# Patient Record
Sex: Male | Born: 1975 | Race: White | Hispanic: No | Marital: Married | Smoking: Never smoker
Health system: Southern US, Community
[De-identification: ages and names within clinical notes are randomized; demographics above are authoritative.]

## PROBLEM LIST (undated history)

## (undated) DIAGNOSIS — R1013 Epigastric pain: Secondary | ICD-10-CM

## (undated) DIAGNOSIS — F329 Major depressive disorder, single episode, unspecified: Secondary | ICD-10-CM

## (undated) DIAGNOSIS — J301 Allergic rhinitis due to pollen: Secondary | ICD-10-CM

## (undated) DIAGNOSIS — F411 Generalized anxiety disorder: Secondary | ICD-10-CM

## (undated) DIAGNOSIS — R748 Abnormal levels of other serum enzymes: Secondary | ICD-10-CM

## (undated) DIAGNOSIS — E66812 Obesity, class 2: Secondary | ICD-10-CM

## (undated) DIAGNOSIS — G4730 Sleep apnea, unspecified: Secondary | ICD-10-CM

## (undated) DIAGNOSIS — Z8781 Personal history of (healed) traumatic fracture: Secondary | ICD-10-CM

## (undated) DIAGNOSIS — K219 Gastro-esophageal reflux disease without esophagitis: Secondary | ICD-10-CM

## (undated) DIAGNOSIS — G4733 Obstructive sleep apnea (adult) (pediatric): Secondary | ICD-10-CM

## (undated) DIAGNOSIS — K644 Residual hemorrhoidal skin tags: Secondary | ICD-10-CM

## (undated) DIAGNOSIS — K760 Fatty (change of) liver, not elsewhere classified: Secondary | ICD-10-CM

## (undated) DIAGNOSIS — Z9989 Dependence on other enabling machines and devices: Secondary | ICD-10-CM

## (undated) DIAGNOSIS — M51369 Other intervertebral disc degeneration, lumbar region without mention of lumbar back pain or lower extremity pain: Secondary | ICD-10-CM

## (undated) DIAGNOSIS — E669 Obesity, unspecified: Secondary | ICD-10-CM

## (undated) DIAGNOSIS — Z8659 Personal history of other mental and behavioral disorders: Secondary | ICD-10-CM

## (undated) DIAGNOSIS — F32A Depression, unspecified: Secondary | ICD-10-CM

## (undated) DIAGNOSIS — M5136 Other intervertebral disc degeneration, lumbar region: Secondary | ICD-10-CM

## (undated) HISTORY — DX: Residual hemorrhoidal skin tags: K64.4

## (undated) HISTORY — DX: Personal history of other mental and behavioral disorders: Z86.59

## (undated) HISTORY — DX: Other intervertebral disc degeneration, lumbar region: M51.36

## (undated) HISTORY — DX: Obstructive sleep apnea (adult) (pediatric): G47.33

## (undated) HISTORY — DX: Obesity, unspecified: E66.9

## (undated) HISTORY — DX: Allergic rhinitis due to pollen: J30.1

## (undated) HISTORY — DX: Gilbert syndrome: E80.4

## (undated) HISTORY — DX: Gastro-esophageal reflux disease without esophagitis: K21.9

## (undated) HISTORY — DX: Epigastric pain: R10.13

## (undated) HISTORY — DX: Obesity, class 2: E66.812

## (undated) HISTORY — DX: Personal history of (healed) traumatic fracture: Z87.81

## (undated) HISTORY — DX: Generalized anxiety disorder: F41.1

## (undated) HISTORY — DX: Fatty (change of) liver, not elsewhere classified: K76.0

## (undated) HISTORY — DX: Abnormal levels of other serum enzymes: R74.8

## (undated) HISTORY — DX: Other intervertebral disc degeneration, lumbar region without mention of lumbar back pain or lower extremity pain: M51.369

## (undated) HISTORY — DX: Depression, unspecified: F32.A

## (undated) HISTORY — DX: Dependence on other enabling machines and devices: Z99.89

---
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
## 2013-02-26 ENCOUNTER — Ambulatory Visit (INDEPENDENT_AMBULATORY_CARE_PROVIDER_SITE_OTHER): Payer: BC Managed Care – PPO | Admitting: Family Medicine

## 2013-02-26 ENCOUNTER — Encounter: Payer: Self-pay | Admitting: Family Medicine

## 2013-02-26 VITALS — BP 134/86 | HR 104 | Temp 97.2°F | Ht 69.0 in | Wt 233.0 lb

## 2013-02-26 DIAGNOSIS — F411 Generalized anxiety disorder: Secondary | ICD-10-CM | POA: Insufficient documentation

## 2013-02-26 DIAGNOSIS — Z7689 Persons encountering health services in other specified circumstances: Secondary | ICD-10-CM

## 2013-02-26 DIAGNOSIS — Z23 Encounter for immunization: Secondary | ICD-10-CM

## 2013-02-26 NOTE — Assessment & Plan Note (Signed)
Problem stable.  Continue current medications and diet appropriate for this condition.  We have reviewed our general long term plan for this problem and also reviewed symptoms and signs that should prompt the patient to call or return to the office.  

## 2013-02-26 NOTE — Assessment & Plan Note (Signed)
Reviewed history.  No acute problems. No old records to obtain. He'll make appt at his convenience in the next 6 mo to get CPE w/fasting labs.  He may get the labs at "doctor's day" lab if he wants, in which case he would not have to fast for his CPE here.

## 2013-02-26 NOTE — Progress Notes (Signed)
Office Note 02/26/2013  CC:  Chief Complaint  Patient presents with  . Establish Care    no problems    HPI:  Daniel Cardenas is a 37 y.o. White male who is here to establish care. Patient's most recent primary MD: none (last saw an MD in med school) Old records were not reviewed prior to or during today's visit.  No acute complaints. 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  He admits he is high strung, feels worried all the time about everything, looking back he knows he has GAD and this led to his depression b/c it was uncontrolled.  He now feels like he is doing well, but in the past when he has tried going off of citalopram he has relapsed into significant anxiety.  Therefore, he feels like he'll need to stay on this med indefinitely.  Past Medical History  Diagnosis Date  . GAD (generalized anxiety disorder)     pt will be on citalopram indefinitely  . History of depression   . GERD (gastroesophageal reflux disease)   . Hay fever   . External hemorrhoid, bleeding     Past Surgical History  Procedure Laterality Date  . Wisdom tooth extraction  1998    Family History  Problem Relation Age of Onset  . Arthritis Mother   . Arthritis Father   . Hyperlipidemia Father   . Hypertension Father   . Mental illness Father     Frontotemporal degeneration  . Diabetes Father   . Stroke Maternal Grandfather   . Hypertension Paternal Grandfather   . Mental illness Paternal Grandfather     Multi-infarct dementia  . Diabetes Paternal Grandfather     History   Social History  . Marital Status: Married    Spouse Name: [name]    Number of Children: 3  . Years of Education: MD   Occupational History  . ANESTHESIOLOGIST    Social History Main Topics  . Smoking status: Never Smoker   . Smokeless tobacco: Never Used  . Alcohol Use: Yes     Comment: occasional  . Drug Use: No  .  Sexually Active: Yes -- Male partner(s)   Other Topics Concern  . Not on file   Social History Narrative   Married, 3 children (ages 68y, 3y, and 2y).   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  Residency--[employer] [facility] [employer] [facility]).   No tobacco.  Occ alcohol.  No drug use/abuse.   No exercise.    Outpatient Encounter Prescriptions as of 02/26/2013  Medication Sig Dispense Refill  . cetirizine (ZYRTEC) 10 MG tablet Take 10 mg by mouth daily.      . citalopram (CELEXA) 20 MG tablet Take 20 mg by mouth daily.      Marland Kitchen omeprazole (PRILOSEC) 10 MG capsule Take 10 mg by mouth daily.       No facility-administered encounter medications on file as of 02/26/2013.    No Known Allergies  ROS Review of Systems  Constitutional: Negative for fever and fatigue.  HENT: Negative for congestion and sore throat.   Eyes: Negative for visual disturbance.  Respiratory: Negative for cough.   Cardiovascular: Negative for chest pain.  Gastrointestinal: Negative for nausea and abdominal pain.  Genitourinary: Negative for dysuria.  Musculoskeletal: Negative for back pain and joint swelling.  Skin: Negative for rash.  Neurological: Negative for weakness and headaches.  Hematological:  Negative for adenopathy.    PE; Blood pressure 134/86, pulse 104, temperature 97.2 F (36.2 C), temperature source Temporal, height 5\' 9"  (1.753 m), weight 233 lb (105.688 kg), SpO2 98.00%. Gen: Alert, well appearing.  Patient is oriented to person, place, time, and situation. ENT: Eyes: no injection, icteris, swelling, or exudate.  EOMI, PERRLA.  Mouth: lips without lesion/swelling.  Oral mucosa pink and moist.  Dentition intact and without obvious caries or gingival swelling.  Oropharynx without erythema, exudate, or swelling.  CV: RRR, no m/r/g.   LUNGS: CTA bilat, nonlabored resps, good aeration in all lung fields. EXT: no clubbing, cyanosis, or edema.   Pertinent labs:  None  today  ASSESSMENT AND PLAN:   NEW PT  GAD (generalized anxiety disorder) Problem stable.  Continue current medications and diet appropriate for this condition.  We have reviewed our general long term plan for this problem and also reviewed symptoms and signs that should prompt the patient to call or return to the office.   Encounter to establish care with new doctor Reviewed history.  No acute problems. No old records to obtain. He'll make appt at his convenience in the next 6 mo to get CPE w/fasting labs.  He may get the labs at "doctor's day" lab if he wants, in which case he would not have to fast for his CPE here.  Flu vaccine IM given today.  No med RF's needed today.  Return for CPE (fasting): schedule this sometime in the next 6 mo.

## 2013-05-14 ENCOUNTER — Encounter: Payer: Self-pay | Admitting: Family Medicine

## 2013-05-14 ENCOUNTER — Ambulatory Visit (INDEPENDENT_AMBULATORY_CARE_PROVIDER_SITE_OTHER): Payer: BC Managed Care – PPO | Admitting: Family Medicine

## 2013-05-14 VITALS — BP 118/88 | HR 91 | Temp 98.0°F | Ht 69.0 in | Wt 237.8 lb

## 2013-05-14 DIAGNOSIS — Z0000 Encounter for general adult medical examination without abnormal findings: Secondary | ICD-10-CM | POA: Insufficient documentation

## 2013-05-14 LAB — CBC WITH DIFFERENTIAL/PLATELET
Basophils Relative: 0.9 % (ref 0.0–3.0)
Eosinophils Relative: 6.5 % — ABNORMAL HIGH (ref 0.0–5.0)
HCT: 48.4 % (ref 39.0–52.0)
Hemoglobin: 17 g/dL (ref 13.0–17.0)
Lymphs Abs: 3 10*3/uL (ref 0.7–4.0)
MCV: 93 fl (ref 78.0–100.0)
Monocytes Absolute: 0.9 10*3/uL (ref 0.1–1.0)
Monocytes Relative: 10.5 % (ref 3.0–12.0)
RBC: 5.2 Mil/uL (ref 4.22–5.81)
WBC: 8.7 10*3/uL (ref 4.5–10.5)

## 2013-05-14 LAB — COMPREHENSIVE METABOLIC PANEL
AST: 21 U/L (ref 0–37)
Albumin: 4.1 g/dL (ref 3.5–5.2)
Alkaline Phosphatase: 126 U/L — ABNORMAL HIGH (ref 39–117)
BUN: 16 mg/dL (ref 6–23)
Glucose, Bld: 88 mg/dL (ref 70–99)
Potassium: 4 mEq/L (ref 3.5–5.1)
Sodium: 137 mEq/L (ref 135–145)
Total Bilirubin: 1.3 mg/dL — ABNORMAL HIGH (ref 0.3–1.2)
Total Protein: 7.3 g/dL (ref 6.0–8.3)

## 2013-05-14 LAB — LIPID PANEL
HDL: 30.2 mg/dL — ABNORMAL LOW (ref >39.00)
LDL Cholesterol: 104 mg/dL — ABNORMAL HIGH (ref 0–99)
VLDL: 31.8 mg/dL (ref 0.0–40.0)

## 2013-05-14 MED ORDER — CITALOPRAM HYDROBROMIDE 20 MG PO TABS: 20.0000 mg | ORAL_TABLET | Freq: Every day | ORAL | Status: DC

## 2013-05-14 NOTE — Patient Instructions (Signed)
Health Maintenance, Males A healthy lifestyle and preventative care can promote health and wellness.  Maintain regular health, dental, and eye exams.  Eat a healthy diet. Foods like vegetables, fruits, whole grains, low-fat dairy products, and lean protein foods contain the nutrients you need without too many calories. Decrease your intake of foods high in solid fats, added sugars, and salt. Get information about a proper diet from your caregiver, if necessary.  Regular physical exercise is one of the most important things you can do for your health. Most adults should get at least 150 minutes of moderate-intensity exercise (any activity that increases your heart rate and causes you to sweat) each week. In addition, most adults need muscle-strengthening exercises on 2 or more days a week.   Maintain a healthy weight. The body mass index (BMI) is a screening tool to identify possible weight problems. It provides an estimate of body fat based on height and weight. Your caregiver can help determine your BMI, and can help you achieve or maintain a healthy weight. For adults 20 years and older:  A BMI below 18.5 is considered underweight.  A BMI of 18.5 to 24.9 is normal.  A BMI of 25 to 29.9 is considered overweight.  A BMI of 30 and above is considered obese.  Maintain normal blood lipids and cholesterol by exercising and minimizing your intake of saturated fat. Eat a balanced diet with plenty of fruits and vegetables. Blood tests for lipids and cholesterol should begin at age 20 and be repeated every 5 years. If your lipid or cholesterol levels are high, you are over 50, or you are a high risk for heart disease, you may need your cholesterol levels checked more frequently.Ongoing high lipid and cholesterol levels should be treated with medicines, if diet and exercise are not effective.  If you smoke, find out from your caregiver how to quit. If you do not use tobacco, do not start.  If you  choose to drink alcohol, do not exceed 2 drinks per day. One drink is considered to be 12 ounces (355 mL) of beer, 5 ounces (148 mL) of wine, or 1.5 ounces (44 mL) of liquor.  Avoid use of street drugs. Do not share needles with anyone. Ask for help if you need support or instructions about stopping the use of drugs.  High blood pressure causes heart disease and increases the risk of stroke. Blood pressure should be checked at least every 1 to 2 years. Ongoing high blood pressure should be treated with medicines if weight loss and exercise are not effective.  If you are 45 to 35 years old, ask your caregiver if you should take aspirin to prevent heart disease.  Diabetes screening involves taking a blood sample to check your fasting blood sugar level. This should be done once every 3 years, after age 45, if you are within normal weight and without risk factors for diabetes. Testing should be considered at a younger age or be carried out more frequently if you are overweight and have at least 1 risk factor for diabetes.  Colorectal cancer can be detected and often prevented. Most routine colorectal cancer screening begins at the age of 50 and continues through age 75. However, your caregiver may recommend screening at an earlier age if you have risk factors for colon cancer. On a yearly basis, your caregiver may provide home test kits to check for hidden blood in the stool. Use of a small camera at the end of a tube,   to directly examine the colon (sigmoidoscopy or colonoscopy), can detect the earliest forms of colorectal cancer. Talk to your caregiver about this at age 50, when routine screening begins. Direct examination of the colon should be repeated every 5 to 10 years through age 75, unless early forms of pre-cancerous polyps or small growths are found.  Hepatitis C blood testing is recommended for all people born from 1945 through 1965 and any individual with known risks for hepatitis C.  Healthy  men should no longer receive prostate-specific antigen (PSA) blood tests as part of routine cancer screening. Consult with your caregiver about prostate cancer screening.  Testicular cancer screening is not recommended for adolescents or adult males who have no symptoms. Screening includes self-exam, caregiver exam, and other screening tests. Consult with your caregiver about any symptoms you have or any concerns you have about testicular cancer.  Practice safe sex. Use condoms and avoid high-risk sexual practices to reduce the spread of sexually transmitted infections (STIs).  Use sunscreen with a sun protection factor (SPF) of 30 or greater. Apply sunscreen liberally and repeatedly throughout the day. You should seek shade when your shadow is shorter than you. Protect yourself by wearing long sleeves, pants, a wide-brimmed hat, and sunglasses year round, whenever you are outdoors.  Notify your caregiver of new moles or changes in moles, especially if there is a change in shape or color. Also notify your caregiver if a mole is larger than the size of a pencil eraser.  A one-time screening for abdominal aortic aneurysm (AAA) and surgical repair of large AAAs by sound wave imaging (ultrasonography) is recommended for ages 65 to 75 years who are current or former smokers.  Stay current with your immunizations. Document Released: 06/10/2008 Document Revised: 03/06/2012 Document Reviewed: 05/10/2011 Exit[employer] Patient Information 2013 [employer].  

## 2013-05-14 NOTE — Progress Notes (Signed)
Office Note 05/14/2013  CC:  Chief Complaint  Patient presents with  . Annual Exam    HPI:  Daniel Cardenas is a 37 y.o. White male who is here for CPE.   No acute complaints. Fasting for labs today. Still not exercising but knows he needs to get in the habit, get some weight off.   Past Medical History  Diagnosis Date  . GAD (generalized anxiety disorder)     pt will be on citalopram indefinitely  . History of depression   . GERD (gastroesophageal reflux disease)     daily prilosec OTC helps  . Hay fever   . External hemorrhoid, bleeding     Past Surgical History  Procedure Laterality Date  . Wisdom tooth extraction  1998    Family History  Problem Relation Age of Onset  . Arthritis Mother   . Arthritis Father   . Hyperlipidemia Father   . Hypertension Father   . Mental illness Father     Frontotemporal degeneration  . Diabetes Father   . Stroke Maternal Grandfather   . Hypertension Paternal Grandfather   . Mental illness Paternal Grandfather     Multi-infarct dementia  . Diabetes Paternal Grandfather     History   Social History  . Marital Status: Married    Spouse Name: Daniel Cardenas    Number of Children: 3  . Years of Education: MD   Occupational History  . ANESTHESIOLOGIST    Social History Main Topics  . Smoking status: Never Smoker   . Smokeless tobacco: Never Used  . Alcohol Use: Yes     Comment: occasional  . Drug Use: No  . Sexually Active: Yes -- Male partner(s)   Other Topics Concern  . Not on file   Social History Narrative   Married, 3 children (ages 23y, 3y, and 2y).   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  Residency--[employer] [facility] [employer]).   No tobacco.  Occ alcohol.  No drug use/abuse.   No exercise.    Outpatient Prescriptions Prior to Visit  Medication Sig Dispense Refill  . cetirizine (ZYRTEC) 10 MG tablet Take 10 mg by mouth daily.      Marland Kitchen omeprazole (PRILOSEC) 10 MG capsule Take  10 mg by mouth daily.      . citalopram (CELEXA) 20 MG tablet Take 20 mg by mouth daily.       No facility-administered medications prior to visit.    No Known Allergies  ROS Review of Systems  Constitutional: Negative for fever, chills, appetite change and fatigue.  HENT: Negative for ear pain, congestion, sore throat, neck stiffness and dental problem.   Eyes: Negative for discharge, redness and visual disturbance.  Respiratory: Negative for cough, chest tightness, shortness of breath and wheezing.   Cardiovascular: Negative for chest pain, palpitations and leg swelling.  Gastrointestinal: Negative for nausea, vomiting, abdominal pain, diarrhea and blood in stool.  Endocrine: Negative for cold intolerance, heat intolerance, polydipsia, polyphagia and polyuria.  Genitourinary: Negative for dysuria, urgency, frequency, hematuria, flank pain and difficulty urinating.  Musculoskeletal: Negative for myalgias, back pain, joint swelling and arthralgias.  Skin: Negative for pallor and rash.  Neurological: Negative for dizziness, speech difficulty, weakness and headaches.  Hematological: Negative for adenopathy. Does not bruise/bleed easily.  Psychiatric/Behavioral: Negative for confusion and sleep disturbance. The patient is not nervous/anxious.      PE; Blood pressure 118/88, pulse 91, temperature 98 F (36.7 C), temperature source Oral, height 5\' 9"  (1.753 m), weight 237  lb 12 oz (107.843 kg), SpO2 96.00%. Gen: Alert, well appearing.  Patient is oriented to person, place, time, and situation. AFFECT: pleasant, lucid thought and speech. ENT: Ears: EACs clear, normal epithelium.  TMs with good light reflex and landmarks bilaterally.  Eyes: no injection, icteris, swelling, or exudate.  EOMI, PERRLA. Nose: no drainage or turbinate edema/swelling.  No injection or focal lesion.  Mouth: lips without lesion/swelling.  Oral mucosa pink and moist.  Dentition intact and without obvious caries or  gingival swelling.  Oropharynx without erythema, exudate, or swelling.  Neck: supple/nontender.  No LAD, mass, or TM.  Carotid pulses 2+ bilaterally, without bruits. CV: RRR, no m/r/g.   LUNGS: CTA bilat, nonlabored resps, good aeration in all lung fields. ABD: soft, NT, ND, BS normal.  No hepatospenomegaly or mass.  No bruits. EXT: no clubbing, cyanosis, or edema.  Musculoskeletal: no joint swelling, erythema, warmth, or tenderness.  ROM of all joints intact. Skin - no sores or suspicious lesions or rashes or color changes.  He has some dirty-brown hyperpigmentation changes on lateral aspects of both ankles, without any palpable rash and without flaking/peeling. Genitals normal; both testes normal without tenderness, masses, hydroceles, varicoceles, erythema or swelling. Shaft normal, circumcised, meatus normal without discharge. No inguinal hernia noted. No inguinal lymphadenopathy.  He has mild tinea cruris diffusely, +  with some mild intertrigo in left groin crease.   Pertinent labs:  None today  ASSESSMENT AND PLAN:   Health maintenance examination Reviewed age and gender appropriate health maintenance issues (prudent diet, regular exercise, health risks of tobacco and excessive alcohol, use of seatbelts, fire alarms in home, use of sunscreen).  Also reviewed age and gender appropriate health screening as well as vaccine recommendations. Vacc's UTD--he'll drop off his records for his chart. Health panel drawn today. Renewed citalopram.  Reviewed BMI, discussed trying to slowly do some TLC's to get some weight off slowly.  FOLLOW UP:  Return in about 1 year (around 05/14/2014) for CPE.

## 2013-05-14 NOTE — Assessment & Plan Note (Signed)
Reviewed age and gender appropriate health maintenance issues (prudent diet, regular exercise, health risks of tobacco and excessive alcohol, use of seatbelts, fire alarms in home, use of sunscreen).  Also reviewed age and gender appropriate health screening as well as vaccine recommendations. Vacc's UTD--he'll drop off his records for his chart. Health panel drawn today. Renewed citalopram.

## 2013-08-22 ENCOUNTER — Ambulatory Visit (INDEPENDENT_AMBULATORY_CARE_PROVIDER_SITE_OTHER): Payer: BC Managed Care – PPO | Admitting: Family Medicine

## 2013-08-22 ENCOUNTER — Encounter: Payer: Self-pay | Admitting: Family Medicine

## 2013-08-22 VITALS — BP 129/87 | HR 85 | Temp 98.3°F | Resp 18 | Ht 69.0 in | Wt 238.0 lb

## 2013-08-22 DIAGNOSIS — G4733 Obstructive sleep apnea (adult) (pediatric): Secondary | ICD-10-CM

## 2013-08-22 DIAGNOSIS — K219 Gastro-esophageal reflux disease without esophagitis: Secondary | ICD-10-CM | POA: Insufficient documentation

## 2013-08-22 NOTE — Progress Notes (Signed)
OFFICE NOTE  08/22/2013  CC:  Chief Complaint  Patient presents with  . Insomnia    possible sleep apnea     HPI: Patient is a 37 y.o. Caucasian male who is here for concern for possible sleep apnea. Wife has video taped him sleeping and lots of snoring is noted and periods of apnea occur.   He does have some excessive daytime sleepiness but also notes he works long hours and sometimes does not get enough sleep at night.  He takes no sedatives to try to go to sleep--"I definitely have no trouble falling asleep at any time, day or night, especially post-call".  His dad had severe OSA and the dx and institution of CPAP was delayed and his Dad's neurologist thinks this led to enough transient anoxic brain injury to contribute to his dad's current dx of frontotemporal dementia.  This info has spurred the patient (and his wife) to make eval for OSA a priority.  Pt has moderate chronic GER sx's.  He went off of his daily prilosec the last couple of months and notes return of sx's worse--takes prn zantac and is trying to get into better eating pattern to control things. He has had this problem at moderate severity for the last 3-5 yrs at least--has required practically daily PPI therapy.  No hx of EGD in the past.  Pertinent PMH:  Past Medical History  Diagnosis Date  . GAD (generalized anxiety disorder)     pt will be on citalopram indefinitely  . History of depression   . GERD (gastroesophageal reflux disease)     daily prilosec OTC helps  . Hay fever   . External hemorrhoid, bleeding    Past Surgical History  Procedure Laterality Date  . Wisdom tooth extraction  1998   History   Social History Narrative   Married, 3 children (ages 49y, 66y, and 2y).   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  Residency--[employer] [facility]).   No tobacco.  Occ alcohol.  No drug use/abuse.   No exercise.    MEDS:  Outpatient Prescriptions Prior to Visit   Medication Sig Dispense Refill  . cetirizine (ZYRTEC) 10 MG tablet Take 10 mg by mouth daily.      . citalopram (CELEXA) 20 MG tablet Take 1 tablet (20 mg total) by mouth daily.  90 tablet  3  . omeprazole (PRILOSEC) 10 MG capsule Take 10 mg by mouth daily.       No facility-administered medications prior to visit.    PE: Blood pressure 129/87, pulse 85, temperature 98.3 F (36.8 C), temperature source Temporal, resp. rate 18, height 5\' 9"  (1.753 m), weight 238 lb (107.956 kg), SpO2 97.00%. Gen: Alert, tired-appearing but NAD.  Patient is oriented to person, place, time, and situation. AFFECT: pleasant, lucid thought and speech. ENT: Eyes: no injection, icteris, swelling, or exudate.  EOMI, PERRLA. Nose: no drainage or turbinate edema/swelling.  No injection or focal lesion.  Mouth: lips without lesion/swelling.  Oral mucosa pink and moist.  Dentition intact and without obvious caries or gingival swelling.  Oropharynx with mild diffuse erythema but no swelling or exudate. Neck - No masses or thyromegaly or limitation in range of motion CV: RRR, no m/r/g.   LUNGS: CTA bilat, nonlabored resps, good aeration in all lung fields. EXT: no clubbing, cyanosis, or edema.   LABS: none today Last set of "health panel" labs 04/2013 were all normal.  IMPRESSION AND PLAN:  OSA (obstructive sleep apnea) Suspected.  Discussed options for w/u and decided the best course of action is to refer him to pulmonology for further evaluation/management.  GERD (gastroesophageal reflux disease) I recommended he adhere to better GER diet and restart daily PPI. Since he has had chronic GER and required daily PPI treatment for >3 yrs, will refer him to GI for consideration of EGD to screen for Barrett's esophagus--but will do this in the near future (after pulm/OSA evaluation).   An After Visit Summary was printed and given to the patient.  FOLLOW UP: 3 mo

## 2013-08-22 NOTE — Assessment & Plan Note (Signed)
Suspected. Discussed options for w/u and decided the best course of action is to refer him to pulmonology for further evaluation/management.

## 2013-08-22 NOTE — Assessment & Plan Note (Signed)
I recommended he adhere to better GER diet and restart daily PPI. Since he has had chronic GER and required daily PPI treatment for >3 yrs, will refer him to GI for consideration of EGD to screen for Barrett's esophagus--but will do this in the near future (after pulm/OSA evaluation).

## 2013-09-27 ENCOUNTER — Ambulatory Visit (INDEPENDENT_AMBULATORY_CARE_PROVIDER_SITE_OTHER): Payer: BC Managed Care – PPO | Admitting: Pulmonary Disease

## 2013-09-27 ENCOUNTER — Encounter: Payer: Self-pay | Admitting: Pulmonary Disease

## 2013-09-27 VITALS — BP 138/98 | HR 105 | Temp 98.1°F | Ht 69.0 in | Wt 244.0 lb

## 2013-09-27 DIAGNOSIS — G4733 Obstructive sleep apnea (adult) (pediatric): Secondary | ICD-10-CM

## 2013-09-27 NOTE — Assessment & Plan Note (Signed)
The patient's history is very suggestive of obstructive sleep apnea, and the patient is overweight with a large and thick neck.  However, a lot of his sleepiness symptoms may be related to his occupation and chronic sleep deprivation.  He has been noted to have loud snoring as well as witnessed apneas by his wife.  Given all of this, I think he is at high risk for having sleep apnea, and will schedule for whom sleep testing for evaluation.

## 2013-09-27 NOTE — Patient Instructions (Addendum)
Will schedule for home sleep testing.  Will call once results are available Work on weight loss 

## 2013-09-27 NOTE — Progress Notes (Signed)
Subjective:    Patient ID: Daniel Cardenas, male    DOB: 1976-05-10, 37 y.o.   MRN: [id number]  HPI The patient is a 37 year old male who I have been asked to see for possible obstructive sleep apnea.  He has been noted to have loud snoring, as well as an abnormal breathing pattern during sleep.  He denies having frequent awakenings, but does feel that he is not rested a lot the mornings.  He can have sleep pressure at times during the day with inactivity, but feels a lot of this is dependent upon the quality of sleep he had the night before.  He denies any sleepiness issues in the evenings with television or movies, but does have some sleep pressure with driving.  Again, his history is very difficult because of his occupation and his sleep hygiene.  The patient states that his weight is up about 20 pounds over the last few years, and his Epworth score today is only 7.   Sleep Questionnaire What time do you typically go to bed?( Between what hours) 10-11pm 10-11pm at 0938 on 09/27/13 by Maisie Fus, CMA How long does it take you to fall asleep?  at 0938 on 09/27/13 by Maisie Fus, CMA How many times during the night do you wake up? 0 0 at 0938 on 09/27/13 by Maisie Fus, CMA What time do you get out of bed to start your day? [id number] at 0938 on 09/27/13 by Maisie Fus, CMA Do you drive or operate heavy machinery in your occupation? No No at 0938 on 09/27/13 by Maisie Fus, CMA How much has your weight changed (up or down) over the past two years? (In pounds) 20 lb (9.072 kg)20 lb (9.072 kg) increase at 0938 on 09/27/13 by Maisie Fus, CMA Have you ever had a sleep study before? No No at 0938 on 09/27/13 by Maisie Fus, CMA Do you currently use CPAP? No No at 0938 on 09/27/13 by Maisie Fus, CMA Do you wear oxygen at any time? No No at 0938 on 09/27/13 by Maisie Fus, CMA   Review of Systems  Constitutional: Negative for fever and  unexpected weight change.  HENT: Negative for ear pain, nosebleeds, congestion, sore throat, rhinorrhea, sneezing, trouble swallowing, dental problem, postnasal drip and sinus pressure.   Eyes: Negative for redness and itching.  Respiratory: Negative for cough, chest tightness, shortness of breath and wheezing.   Cardiovascular: Negative for palpitations and leg swelling.  Gastrointestinal: Negative for nausea and vomiting.       Acid heartburn  Genitourinary: Negative for dysuria.  Musculoskeletal: Negative for joint swelling.  Skin: Negative for rash.  Neurological: Negative for headaches.  Hematological: Does not bruise/bleed easily.  Psychiatric/Behavioral: Negative for dysphoric mood. The patient is not nervous/anxious.        Objective:   Physical Exam Constitutional:  Well developed, no acute distress  HENT:  Nares patent without discharge, mild septal deviation to the left  Oropharynx without exudate, palate and uvula are mildly elongated.  Eyes:  Perrla, eomi, no scleral icterus  Neck:  No JVD, no TMG  Cardiovascular:  Normal rate, regular rhythm, no rubs or gallops.  No murmurs        Intact distal pulses  Pulmonary :  Normal breath sounds, no stridor or respiratory distress   No rales, rhonchi, or wheezing  Abdominal:  Soft, nondistended, bowel sounds present.  No tenderness noted.   Musculoskeletal:  No  lower extremity edema noted.  Lymph Nodes:  No cervical lymphadenopathy noted  Skin:  No cyanosis noted  Neurologic:  Alert, appropriate, moves all 4 extremities without obvious deficit.         Assessment & Plan:

## 2013-12-17 ENCOUNTER — Ambulatory Visit: Payer: BC Managed Care – PPO | Admitting: Family Medicine

## 2014-05-08 ENCOUNTER — Other Ambulatory Visit: Payer: Self-pay | Admitting: Family Medicine

## 2014-05-08 MED ORDER — CITALOPRAM HYDROBROMIDE 20 MG PO TABS: 20.0000 mg | ORAL_TABLET | Freq: Every day | ORAL | Status: DC

## 2014-05-08 NOTE — Telephone Encounter (Signed)
Pt's wife called requesting rf of citalopram.  I sent in 30 day supply into [facility].  Patient will need to come into office for follow up for further refills.

## 2014-06-18 ENCOUNTER — Encounter: Payer: Self-pay | Admitting: Family Medicine

## 2014-06-18 ENCOUNTER — Ambulatory Visit (INDEPENDENT_AMBULATORY_CARE_PROVIDER_SITE_OTHER): Payer: BC Managed Care – PPO | Admitting: Family Medicine

## 2014-06-18 VITALS — BP 126/91 | HR 102 | Temp 99.0°F | Resp 18 | Ht 69.0 in | Wt 236.0 lb

## 2014-06-18 DIAGNOSIS — F411 Generalized anxiety disorder: Secondary | ICD-10-CM

## 2014-06-18 DIAGNOSIS — G4733 Obstructive sleep apnea (adult) (pediatric): Secondary | ICD-10-CM

## 2014-06-18 MED ORDER — CITALOPRAM HYDROBROMIDE 40 MG PO TABS: 40.0000 mg | ORAL_TABLET | Freq: Every day | ORAL | Status: DC

## 2014-06-18 MED ORDER — ATENOLOL 50 MG PO TABS: ORAL_TABLET | ORAL | Status: DC

## 2014-06-18 NOTE — Progress Notes (Signed)
Pre visit review using our clinic review tool, if applicable. No additional management support is needed unless otherwise documented below in the visit note. 

## 2014-06-18 NOTE — Progress Notes (Signed)
OFFICE NOTE  06/23/2014  CC:  Chief Complaint  Patient presents with  . Follow-up  . Referral    sleep apnea   HPI: Patient is a 38 y.o. Caucasian male who is here for :  1) wants to get sleep study scheduled.  Saw pulm last year and they recommended sleep study.  His insurance would not approve home sleep study.  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  This sent him into a depression in which he basically "checked out" and was able to basically keep working and spend time with family but did little else in the way of his physical or mental health. He is now doing a bit better and wants to get his sleep study scheduled b/c he continues to have snoring, apneic spells, and excessive daytime somnolence.  2) Anxiety: says he feels like he is still struggling with chronic worry, citalopram 20 mg qd not working quite as well. He also feels near panic symptoms when he thinks much about his oral board exams that he has to repeat in the spring of 2016.    Pertinent PMH:  Past medical, surgical, social, and family history reviewed and no changes are noted since last office visit.  MEDS:  Outpatient Prescriptions Prior to Visit  Medication Sig Dispense Refill  . cetirizine (ZYRTEC) 10 MG tablet Take 10 mg by mouth daily.      Marland Kitchen. omeprazole (PRILOSEC) 20 MG capsule Take 20 mg by mouth daily.      . citalopram (CELEXA) 20 MG tablet Take 1 tablet (20 mg total) by mouth daily.  30 tablet  0   No facility-administered medications prior to visit.    PE: Blood pressure 126/91, pulse 102, temperature 99 F (37.2 C), temperature source Temporal, resp. rate 18, height 5\' 9"  (1.753 m), weight 236 lb (107.049 kg), SpO2 98.00%. Gen: Alert, well appearing.  Patient is oriented to person, place, time, and situation. AFFECT: pleasant, lucid thought and speech.  LAB: none today  IMPRESSION AND PLAN:  OSA (obstructive sleep apnea) Pt  now ready to schedule sleep study, so I'll order this today.  GAD (generalized anxiety disorder) Increase exercise, work on diet. Increase citalopram to 40mg  qd.  He is averse to trial of benzo prn but is open to trial of beta blocker use prn to help control some of his panic sx's--esp in prep for his oral board exam coming up again next spring. Atenolol 50 mg qd prn.  Therapeutic expectations and side effect profile of medication discussed today.  Patient's questions answered. He declines counseling at this time.  An After Visit Summary was printed and given to the patient.  FOLLOW UP: 6-8 wks

## 2014-06-21 ENCOUNTER — Ambulatory Visit: Payer: BC Managed Care – PPO | Admitting: Family Medicine

## 2014-06-23 NOTE — Assessment & Plan Note (Signed)
Increase exercise, work on diet. Increase citalopram to 40mg  qd.  He is averse to trial of benzo prn but is open to trial of beta blocker use prn to help control some of his panic sx's--esp in prep for his oral board exam coming up again next spring. Atenolol 50 mg qd prn.  Therapeutic expectations and side effect profile of medication discussed today.  Patient's questions answered. He declines counseling at this time.

## 2014-06-23 NOTE — Assessment & Plan Note (Signed)
Pt now ready to schedule sleep study, so I'll order this today.

## 2014-08-02 ENCOUNTER — Encounter (HOSPITAL_BASED_OUTPATIENT_CLINIC_OR_DEPARTMENT_OTHER): Payer: BC Managed Care – PPO

## 2014-08-12 ENCOUNTER — Encounter: Payer: Self-pay | Admitting: Family Medicine

## 2014-08-12 ENCOUNTER — Ambulatory Visit (INDEPENDENT_AMBULATORY_CARE_PROVIDER_SITE_OTHER): Payer: BC Managed Care – PPO | Admitting: Family Medicine

## 2014-08-12 VITALS — BP 130/88 | HR 82 | Temp 98.9°F | Resp 18 | Ht 69.0 in | Wt 238.0 lb

## 2014-08-12 DIAGNOSIS — K219 Gastro-esophageal reflux disease without esophagitis: Secondary | ICD-10-CM

## 2014-08-12 DIAGNOSIS — G4733 Obstructive sleep apnea (adult) (pediatric): Secondary | ICD-10-CM

## 2014-08-12 DIAGNOSIS — F411 Generalized anxiety disorder: Secondary | ICD-10-CM

## 2014-08-12 DIAGNOSIS — Z0000 Encounter for general adult medical examination without abnormal findings: Secondary | ICD-10-CM

## 2014-08-12 NOTE — Progress Notes (Signed)
OFFICE NOTE  08/12/2014  CC:  Chief Complaint  Patient presents with  . Follow-up   HPI: Patient is a 38 y.o. Caucasian male who is here for 2 mo f/u GAD.   Doing much better on cital 40mg  qd and is taking atenolol 1/2 of 50mg  tab most days.  He is considering taking it as daily med instead of prn anxiety med.  No side effects, bp and HR staying wnl.  Has not had sleep study yet b/c of scheduling conflict that came up and it is now set for late September.  Pertinent PMH:  Past medical, surgical, social, and family history reviewed and no changes are noted since last office visit.  MEDS:  Outpatient Prescriptions Prior to Visit  Medication Sig Dispense Refill  . atenolol (TENORMIN) 50 MG tablet 1 tab po qd prn anxiety  30 tablet  3  . cetirizine (ZYRTEC) 10 MG tablet Take 10 mg by mouth daily.      . citalopram (CELEXA) 40 MG tablet Take 1 tablet (40 mg total) by mouth daily.  30 tablet  3  . omeprazole (PRILOSEC) 20 MG capsule Take 20 mg by mouth daily.       No facility-administered medications prior to visit.    PE: Blood pressure 130/88, pulse 82, temperature 98.9 F (37.2 C), temperature source Temporal, resp. rate 18, height 5\' 9"  (1.753 m), weight 238 lb (107.956 kg), SpO2 97.00%. Gen: Alert, well appearing.  Patient is oriented to person, place, time, and situation. CV: RRR, no m/r/g.   LUNGS: CTA bilat, nonlabored resps, good aeration in all lung fields.  IMPRESSION AND PLAN:  1) GAD; improved.  Cont 40mg  qd cital and 1/2 of 50mg  atenolol either qd or prn anxiety.  2) OSA: to get sleep study next month.  3) GERD: continue PPI qd.  He is interested in pursuing EGD to screen for Barrett's esophagus in the not-to-distant future since he has had longstanding GERD.  An After Visit Summary was printed and given to the patient.  FOLLOW UP: 29mo

## 2014-08-12 NOTE — Progress Notes (Signed)
Pre visit review using our clinic review tool, if applicable. No additional management support is needed unless otherwise documented below in the visit note. 

## 2014-08-27 DIAGNOSIS — Z9989 Dependence on other enabling machines and devices: Secondary | ICD-10-CM

## 2014-08-27 DIAGNOSIS — G4733 Obstructive sleep apnea (adult) (pediatric): Secondary | ICD-10-CM

## 2014-08-27 HISTORY — DX: Obstructive sleep apnea (adult) (pediatric): Z99.89

## 2014-08-27 HISTORY — DX: Obstructive sleep apnea (adult) (pediatric): G47.33

## 2014-09-20 ENCOUNTER — Ambulatory Visit (HOSPITAL_BASED_OUTPATIENT_CLINIC_OR_DEPARTMENT_OTHER): Payer: BC Managed Care – PPO | Attending: Family Medicine

## 2014-09-20 VITALS — Ht 69.0 in | Wt 240.0 lb

## 2014-09-20 DIAGNOSIS — G4733 Obstructive sleep apnea (adult) (pediatric): Secondary | ICD-10-CM | POA: Diagnosis present

## 2014-09-30 DIAGNOSIS — G4733 Obstructive sleep apnea (adult) (pediatric): Secondary | ICD-10-CM

## 2014-09-30 NOTE — Sleep Study (Signed)
   NAME: Daniel LoronJohn Cardenas DATE OF BIRTH:  10/25/1976 MEDICAL RECORD NUMBER [id number]  LOCATION: [facility]  PHYSICIAN: Barbaraann ShareCLANCE,Jonel Sick M  DATE OF STUDY: 09/20/2014  SLEEP STUDY TYPE: Nocturnal Polysomnogram               REFERRING PHYSICIAN: McGowen, Maryjean MornPhilip H, MD  INDICATION FOR STUDY: Hypersomnia with sleep apnea  EPWORTH SLEEPINESS SCORE:  5 HEIGHT: 5\' 9"  (175.3 cm)  WEIGHT: 240 lb (108.863 kg)    Body mass index is 35.43 kg/(m^2).  NECK SIZE: 17 in.  MEDICATIONS: Reviewed in the sleep record  SLEEP ARCHITECTURE: The patient had a total sleep time of 363 minutes with decreased slow-wave sleep and also decreased REM. Sleep onset latency was normal at 17 minutes, and REM onset was at the upper limits of normal. Sleep efficiency was fairly normal at 90%.  RESPIRATORY DATA: The patient was found to have 108 apneas and 269 obstructive hypopneas, giving him an AHI of 62 events per hour. The events occurred in all body positions, and there was very loud snoring noted throughout.  OXYGEN DATA: There was oxygen desaturation as low as 80% with the patient's obstructive events  CARDIAC DATA: No clinically significant arrhythmias were noted  MOVEMENT/PARASOMNIA: No significant periodic limb movements or other abnormal behaviors were seen.  IMPRESSION/ RECOMMENDATION:    1) Severe obstructive sleep apnea/hypopnea syndrome, with an AHI of 62 events per hour and oxygen desaturation as low as 80%. Treatment for this degree of sleep apnea should focus on a trial of CPAP, as well as weight loss.    Barbaraann ShareLANCE,Tahjanae Blankenburg M Diplomate, American Board of Sleep Medicine  ELECTRONICALLY SIGNED ON:  09/30/2014, 3:48 PM  [facility] PH: [phone]   FX: [phone] ACCREDITED BY THE AMERICAN ACADEMY OF SLEEP MEDICINE

## 2014-10-14 ENCOUNTER — Other Ambulatory Visit (INDEPENDENT_AMBULATORY_CARE_PROVIDER_SITE_OTHER): Payer: BC Managed Care – PPO

## 2014-10-14 DIAGNOSIS — Z0000 Encounter for general adult medical examination without abnormal findings: Secondary | ICD-10-CM

## 2014-10-14 LAB — COMPREHENSIVE METABOLIC PANEL
ALK PHOS: 133 U/L — AB (ref 39–117)
ALT: 35 U/L (ref 0–53)
AST: 25 U/L (ref 0–37)
Albumin: 3.7 g/dL (ref 3.5–5.2)
BILIRUBIN TOTAL: 1.1 mg/dL (ref 0.2–1.2)
BUN: 14 mg/dL (ref 6–23)
CO2: 30 mEq/L (ref 19–32)
Calcium: 9.4 mg/dL (ref 8.4–10.5)
Chloride: 104 mEq/L (ref 96–112)
Creatinine, Ser: 0.8 mg/dL (ref 0.4–1.5)
GFR: 111.45 mL/min (ref >60.00)
Glucose, Bld: 95 mg/dL (ref 70–99)
POTASSIUM: 4.7 meq/L (ref 3.5–5.1)
Sodium: 141 mEq/L (ref 135–145)
Total Protein: 7.4 g/dL (ref 6.0–8.3)

## 2014-10-14 LAB — CBC WITH DIFFERENTIAL/PLATELET
BASOS ABS: 0.1 10*3/uL (ref 0.0–0.1)
Basophils Relative: 0.9 % (ref 0.0–3.0)
EOS ABS: 0.6 10*3/uL (ref 0.0–0.7)
Eosinophils Relative: 7.4 % — ABNORMAL HIGH (ref 0.0–5.0)
HCT: 48.5 % (ref 39.0–52.0)
HEMOGLOBIN: 16.4 g/dL (ref 13.0–17.0)
LYMPHS PCT: 32.2 % (ref 12.0–46.0)
Lymphs Abs: 2.5 10*3/uL (ref 0.7–4.0)
MCHC: 33.8 g/dL (ref 30.0–36.0)
MCV: 94.9 fl (ref 78.0–100.0)
MONOS PCT: 10.2 % (ref 3.0–12.0)
Monocytes Absolute: 0.8 10*3/uL (ref 0.1–1.0)
NEUTROS ABS: 3.8 10*3/uL (ref 1.4–7.7)
NEUTROS PCT: 49.3 % (ref 43.0–77.0)
PLATELETS: 231 10*3/uL (ref 150.0–400.0)
RBC: 5.11 Mil/uL (ref 4.22–5.81)
RDW: 13.3 % (ref 11.5–15.5)
WBC: 7.7 10*3/uL (ref 4.0–10.5)

## 2014-10-14 LAB — LIPID PANEL
Cholesterol: 189 mg/dL (ref 0–200)
HDL: 28.9 mg/dL — ABNORMAL LOW (ref >39.00)
LDL CALC: 125 mg/dL — AB (ref 0–99)
NonHDL: 160.1
TRIGLYCERIDES: 174 mg/dL — AB (ref 0.0–149.0)
Total CHOL/HDL Ratio: 7
VLDL: 34.8 mg/dL (ref 0.0–40.0)

## 2014-10-14 LAB — TSH: TSH: 2.46 u[IU]/mL (ref 0.35–4.50)

## 2014-10-23 ENCOUNTER — Telehealth: Payer: Self-pay | Admitting: Pulmonary Disease

## 2014-10-23 ENCOUNTER — Ambulatory Visit (INDEPENDENT_AMBULATORY_CARE_PROVIDER_SITE_OTHER): Payer: BC Managed Care – PPO | Admitting: Family Medicine

## 2014-10-23 ENCOUNTER — Encounter: Payer: Self-pay | Admitting: Family Medicine

## 2014-10-23 VITALS — BP 131/85 | HR 109 | Temp 99.2°F | Resp 18 | Ht 69.0 in | Wt 240.0 lb

## 2014-10-23 DIAGNOSIS — G4733 Obstructive sleep apnea (adult) (pediatric): Secondary | ICD-10-CM

## 2014-10-23 DIAGNOSIS — R748 Abnormal levels of other serum enzymes: Secondary | ICD-10-CM | POA: Insufficient documentation

## 2014-10-23 NOTE — Progress Notes (Signed)
OFFICE NOTE  10/23/2014  CC:  Chief Complaint  Patient presents with  . Results   HPI: Patient is a 38 y.o. Caucasian male who is here for discussion of sleep study results and to go over recent CBC, CMET, TSH, and FLP. Feeling fine except chronic fatigue/sleepiness, no acute complaints.   We reviewed his labs in detail: his LDL was up a bit from last year, otherwise not much change in lipid panel. He is currently not exercising or dieting as had been the plan.  He has been too busy with taking care of his family (wife expecting baby any day now), working. His sleep study was reviewed and I did not initially see Dr. Janifer Adie interpretation b/c an EPIC EMR mistake resulted in it going straight into "notes" section of chart so I did not know it was there.  I called Dr. Gwenette Greet while pt was here today and he graciously pointed out that the result was in the notes section and was GROSSLY positive for OSA and he needs to get on CPAP.  I passed this on to the patient.  Pertinent PMH:  Past medical, surgical, social, and family history reviewed and no changes are noted since last office visit.  MEDS:  Outpatient Prescriptions Prior to Visit  Medication Sig Dispense Refill  . atenolol (TENORMIN) 50 MG tablet 1 tab po qd prn anxiety  30 tablet  3  . cetirizine (ZYRTEC) 10 MG tablet Take 10 mg by mouth daily.      . citalopram (CELEXA) 40 MG tablet Take 1 tablet (40 mg total) by mouth daily.  30 tablet  3  . omeprazole (PRILOSEC) 20 MG capsule Take 20 mg by mouth daily.       No facility-administered medications prior to visit.    PE: Blood pressure 131/85, pulse 109, temperature 99.2 F (37.3 C), temperature source Temporal, resp. rate 18, height 5' 9"  (1.753 m), weight 240 lb (108.863 kg), SpO2 98.00%. Gen: Alert, well appearing.  Patient is oriented to person, place, time, and situation. No further exam today.  LAB:  Lab Results  Component Value Date   WBC 7.7 10/14/2014   HGB 16.4  10/14/2014   HCT 48.5 10/14/2014   MCV 94.9 10/14/2014   PLT 231.0 10/14/2014     Chemistry      Component Value Date/Time   NA 141 10/14/2014 0857   K 4.7 10/14/2014 0857   CL 104 10/14/2014 0857   CO2 30 10/14/2014 0857   BUN 14 10/14/2014 0857   CREATININE 0.8 10/14/2014 0857      Component Value Date/Time   CALCIUM 9.4 10/14/2014 0857   ALKPHOS 133* 10/14/2014 0857   AST 25 10/14/2014 0857   ALT 35 10/14/2014 0857   BILITOT 1.1 10/14/2014 0857     Lab Results  Component Value Date   TSH 2.46 10/14/2014   Lab Results  Component Value Date   CHOL 189 10/14/2014   HDL 28.90* 10/14/2014   LDLCALC 125* 10/14/2014   TRIG 174.0* 10/14/2014   CHOLHDL 7 10/14/2014    IMPRESSION AND PLAN:  1) OSA, needs CPAP.   He has seen Dr. Gwenette Greet and is established so I gave him [facility] phone number and pt will call for office visit to further discuss treatment.  2) Screening labs: Regarding his cholesterol, he knows he needs to get more aggressive with TLC, no cholesterol-lowering meds at this time.  His Alk phos has been very slightly elevated each of the last  2 years.  He seems to think that this has been the case for a long time (since med school) and says it has never been looked into (no old records available). He is agreeable to checking a lipase and looking at an abdominal ultrasound in the near future. Currently he has a lot going on with the OSA issue and he is about to have a new infant at home any day now, so we'll pick up on this issue again in a few months.  An After Visit Summary was printed and given to the patient.  FOLLOW UP: prn

## 2014-10-23 NOTE — Patient Instructions (Signed)
Call [phone] to make office visit with Dr. Shelle Ironlance to further discuss management of OSA.

## 2014-10-23 NOTE — Progress Notes (Signed)
Pre visit review using our clinic review tool, if applicable. No additional management support is needed unless otherwise documented below in the visit note. 

## 2014-11-19 ENCOUNTER — Encounter: Payer: Self-pay | Admitting: Pulmonary Disease

## 2014-11-19 ENCOUNTER — Ambulatory Visit (INDEPENDENT_AMBULATORY_CARE_PROVIDER_SITE_OTHER): Payer: BC Managed Care – PPO | Admitting: Pulmonary Disease

## 2014-11-19 VITALS — BP 130/86 | HR 111 | Temp 97.1°F | Ht 69.0 in | Wt 274.0 lb

## 2014-11-19 DIAGNOSIS — G4733 Obstructive sleep apnea (adult) (pediatric): Secondary | ICD-10-CM

## 2014-11-19 NOTE — Assessment & Plan Note (Signed)
The patient has severe obstructive sleep apnea by his recent study, and will require aggressive treatment with C Pap while working on weight loss. The patient is agreeable to this approach. I will set the patient up on cpap at a moderate pressure level to allow for desensitization, and will troubleshoot the device over the next 4-6weeks if needed.  The pt is to call me if having issues with tolerance.  Will then optimize the pressure once patient is able to wear cpap on a consistent basis.

## 2014-11-19 NOTE — Progress Notes (Signed)
   Subjective:    Patient ID: Daniel Cardenas, male    DOB: 02/15/1976, 38 y.o.   MRN: [id number]  HPI Patient comes in today for follow-up of his recent sleep study. He was found to have severe obstructive sleep apnea, with an AHI of 62 events per hour. I have reviewed the results with him in detail, and answered all of his questions.   Review of Systems  Constitutional: Negative for fever and unexpected weight change.  HENT: Negative for congestion, dental problem, ear pain, nosebleeds, postnasal drip, rhinorrhea, sinus pressure, sneezing, sore throat and trouble swallowing.   Eyes: Negative for redness and itching.  Respiratory: Negative for cough, chest tightness, shortness of breath and wheezing.   Cardiovascular: Negative for palpitations and leg swelling.  Gastrointestinal: Negative for nausea and vomiting.  Genitourinary: Negative for dysuria.  Musculoskeletal: Negative for joint swelling.  Skin: Negative for rash.  Neurological: Negative for headaches.  Hematological: Does not bruise/bleed easily.  Psychiatric/Behavioral: Negative for dysphoric mood. The patient is not nervous/anxious.        Objective:   Physical Exam Obese male in no acute distress Nose without purulence or discharge noted Neck without lymphadenopathy or thyromegaly Lower extremities without edema, no cyanosis Alert and oriented, moves all 4 extremities.       Assessment & Plan:

## 2014-11-19 NOTE — Patient Instructions (Signed)
Will start on cpap at a moderate pressure level.  Please call if having tolerance issues.  Work on weight loss followup with me again in 8 weeks.  

## 2014-11-25 ENCOUNTER — Other Ambulatory Visit: Payer: Self-pay | Admitting: Family Medicine

## 2014-11-25 MED ORDER — CITALOPRAM HYDROBROMIDE 40 MG PO TABS: 40.0000 mg | ORAL_TABLET | Freq: Every day | ORAL | Status: DC

## 2015-01-14 ENCOUNTER — Ambulatory Visit (INDEPENDENT_AMBULATORY_CARE_PROVIDER_SITE_OTHER): Payer: BLUE CROSS/BLUE SHIELD | Admitting: Pulmonary Disease

## 2015-01-14 ENCOUNTER — Encounter: Payer: Self-pay | Admitting: Pulmonary Disease

## 2015-01-14 DIAGNOSIS — G4733 Obstructive sleep apnea (adult) (pediatric): Secondary | ICD-10-CM

## 2015-01-14 NOTE — Patient Instructions (Signed)
Will increase your pressure range to 5-20cm Keep up with mask cushion changes and supplies. Work on weight loss followup with me again in 6mos

## 2015-01-14 NOTE — Assessment & Plan Note (Signed)
The patient is doing very well since being on C Pap, and his download shows great compliance with excellent control of his AHI. His alertness during the day has greatly improved, and he feels that his sleep is more restorative. I have asked him to continue on his C Pap device, and to keep up with his mask changes and supplies. He is to follow-up with me again in 6 months.

## 2015-01-14 NOTE — Progress Notes (Signed)
   Subjective:    Patient ID: Daniel Cardenas, male    DOB: 03/30/1976, 39 y.o.   MRN: [id number]  HPI The patient comes in today for follow-up of his obstructive sleep apnea. He was started on CPAP at the last visit, and has done very well with his device. He reports no issues with his pressure or mask fit, and has seen a significant improvement in his sleep and daytime alertness. He no longer gets sleepy driving, and rarely takes a nap. His download shows excellent compliance, and good control of his AHI with no significant mask leaks.   Review of Systems  Constitutional: Negative for fever and unexpected weight change.  HENT: Negative for congestion, dental problem, ear pain, nosebleeds, postnasal drip, rhinorrhea, sinus pressure, sneezing, sore throat and trouble swallowing.   Eyes: Negative for redness and itching.  Respiratory: Negative for cough, chest tightness, shortness of breath and wheezing.   Cardiovascular: Negative for palpitations and leg swelling.  Gastrointestinal: Negative for nausea and vomiting.  Genitourinary: Negative for dysuria.  Musculoskeletal: Negative for joint swelling.  Skin: Negative for rash.  Neurological: Negative for headaches.  Hematological: Does not bruise/bleed easily.  Psychiatric/Behavioral: Negative for dysphoric mood. The patient is not nervous/anxious.        Objective:   Physical Exam Overweight male in no acute distress Nose without purulence or discharge noted No skin breakdown or pressure necrosis from the C Pap mask Alert and oriented, does not appear to be sleepy, moves all 4 extremities.       Assessment & Plan:

## 2015-01-15 ENCOUNTER — Encounter: Payer: Self-pay | Admitting: Family Medicine

## 2015-03-06 ENCOUNTER — Ambulatory Visit (INDEPENDENT_AMBULATORY_CARE_PROVIDER_SITE_OTHER): Payer: BLUE CROSS/BLUE SHIELD | Admitting: Family Medicine

## 2015-03-06 ENCOUNTER — Encounter: Payer: Self-pay | Admitting: Family Medicine

## 2015-03-06 VITALS — BP 116/78 | HR 69 | Temp 97.5°F | Ht 69.0 in | Wt 249.0 lb

## 2015-03-06 DIAGNOSIS — R748 Abnormal levels of other serum enzymes: Secondary | ICD-10-CM

## 2015-03-06 DIAGNOSIS — F411 Generalized anxiety disorder: Secondary | ICD-10-CM

## 2015-03-06 DIAGNOSIS — F419 Anxiety disorder, unspecified: Secondary | ICD-10-CM

## 2015-03-06 DIAGNOSIS — Z9989 Dependence on other enabling machines and devices: Secondary | ICD-10-CM

## 2015-03-06 DIAGNOSIS — F418 Other specified anxiety disorders: Secondary | ICD-10-CM

## 2015-03-06 DIAGNOSIS — G4733 Obstructive sleep apnea (adult) (pediatric): Secondary | ICD-10-CM

## 2015-03-06 MED ORDER — OMEPRAZOLE 20 MG PO CPDR: 20.0000 mg | DELAYED_RELEASE_CAPSULE | Freq: Every day | ORAL | Status: DC

## 2015-03-06 MED ORDER — ATENOLOL 50 MG PO TABS: ORAL_TABLET | ORAL | Status: DC

## 2015-03-06 MED ORDER — CITALOPRAM HYDROBROMIDE 40 MG PO TABS: 40.0000 mg | ORAL_TABLET | Freq: Every day | ORAL | Status: DC

## 2015-03-06 MED ORDER — OMEPRAZOLE 10 MG PO CPDR: 10.0000 mg | DELAYED_RELEASE_CAPSULE | Freq: Every day | ORAL | Status: DC

## 2015-03-06 NOTE — Progress Notes (Signed)
OFFICE NOTE  03/06/2015  CC:  Chief Complaint  Patient presents with  . Follow-up   HPI: Patient is a 39 y.o. Caucasian male who is here for f/u GAD/anxiety and OSA. Anxiety well controlled on cital 40mg  qd, also taking of atenolol 50mg  qd and this is helping with his anxiety as well. 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  He is much improved regarding his quality of sleep and daytime somnolence/fatigue since getting on CPAP for his severe OSA (settings 5-15).  Has f/u with his pulmonologist soon.  Has hx of mildly elevated alkaline phosphatase on a few lab checks.  He has no GI/abd complaints.   Pertinent PMH:  Past medical, surgical, social, and family history reviewed and no changes are noted since last office visit.  MEDS:  Outpatient Prescriptions Prior to Visit  Medication Sig Dispense Refill  . atenolol (TENORMIN) 50 MG tablet 1 tab po qd prn anxiety 30 tablet 3  . cetirizine (ZYRTEC) 10 MG tablet Take 10 mg by mouth daily.    . citalopram (CELEXA) 40 MG tablet Take 1 tablet (40 mg total) by mouth daily. 30 tablet 3  . omeprazole (PRILOSEC) 20 MG capsule Take 10 mg by mouth daily.      No facility-administered medications prior to visit.    PE: Blood pressure 116/78, pulse 69, temperature 97.5 F (36.4 C), temperature source Temporal, height 5\' 9"  (1.753 m), weight 249 lb (112.946 kg), SpO2 93 %. Gen: Alert, well appearing.  Patient is oriented to person, place, time, and situation. AFFECT: pleasant, lucid thought and speech. No further exam today.  IMPRESSION AND PLAN:  1) GAD with performance anxiety: doing well on citalopram 40mg  qd and 1/2 of 50mg  atenolol daily. Continue current meds, RF's sent in today.  2) OSA, on CPAP: doing very well.  Keep approp f/u with pulm.  3) Elevated alkaline phosphatase level (recurrent), w/out any other lab abnormalities and without any symptoms. Will repeat this today, also check lipase.   If still elevated, the plan is to check abd ultrasound.  An After Visit Summary was printed and given to the patient.  FOLLOW UP: 36mo

## 2015-03-06 NOTE — Progress Notes (Signed)
Pre visit review using our clinic review tool, if applicable. No additional management support is needed unless otherwise documented below in the visit note. 

## 2015-03-07 LAB — COMPREHENSIVE METABOLIC PANEL
ALK PHOS: 142 U/L — AB (ref 39–117)
ALT: 41 U/L (ref 0–53)
AST: 23 U/L (ref 0–37)
Albumin: 4.7 g/dL (ref 3.5–5.2)
BUN: 17 mg/dL (ref 6–23)
CO2: 31 mEq/L (ref 19–32)
Calcium: 9.8 mg/dL (ref 8.4–10.5)
Chloride: 104 mEq/L (ref 96–112)
Creatinine, Ser: 1 mg/dL (ref 0.40–1.50)
GFR: 89.69 mL/min (ref >60.00)
GLUCOSE: 85 mg/dL (ref 70–99)
Potassium: 4.1 mEq/L (ref 3.5–5.1)
Sodium: 138 mEq/L (ref 135–145)
Total Bilirubin: 1.1 mg/dL (ref 0.2–1.2)
Total Protein: 7.4 g/dL (ref 6.0–8.3)

## 2015-03-07 LAB — LIPASE: LIPASE: 22 U/L (ref 11.0–59.0)

## 2015-04-24 ENCOUNTER — Other Ambulatory Visit: Payer: Self-pay | Admitting: Family Medicine

## 2015-04-24 MED ORDER — OMEPRAZOLE 20 MG PO CPDR
20.0000 mg | DELAYED_RELEASE_CAPSULE | Freq: Every day | ORAL | Status: DC
Start: 2015-04-24 — End: 2016-01-16

## 2015-04-24 MED ORDER — ATENOLOL 50 MG PO TABS: ORAL_TABLET | ORAL | Status: DC

## 2015-04-24 MED ORDER — CITALOPRAM HYDROBROMIDE 40 MG PO TABS: 40.0000 mg | ORAL_TABLET | Freq: Every day | ORAL | Status: DC

## 2015-06-09 ENCOUNTER — Encounter: Payer: Self-pay | Admitting: Family Medicine

## 2015-06-09 ENCOUNTER — Ambulatory Visit (INDEPENDENT_AMBULATORY_CARE_PROVIDER_SITE_OTHER): Payer: BLUE CROSS/BLUE SHIELD | Admitting: Family Medicine

## 2015-06-09 VITALS — BP 127/84 | HR 82 | Temp 97.7°F | Resp 16 | Wt 252.0 lb

## 2015-06-09 DIAGNOSIS — H6091 Unspecified otitis externa, right ear: Secondary | ICD-10-CM

## 2015-06-09 MED ORDER — NEOMYCIN-POLYMYXIN-HC 3.5-10000-1 OT SOLN: 4.0000 [drp] | Freq: Four times a day (QID) | OTIC | Status: DC

## 2015-06-09 NOTE — Progress Notes (Signed)
OFFICE NOTE  06/09/2015  CC:  Chief Complaint  Patient presents with  . Ear Pain    right ear x 1 week   HPI: Patient is a 39 y.o. Caucasian male who is here for right ear pain x 1 week. Intensity waxing/waning, some hearing impairment in that ear now.  Felt a bit sick yesterday. No fever.    Pertinent PMH:  Past medical, surgical, social, and family history reviewed and no changes are noted since last office visit.  MEDS:  Outpatient Prescriptions Prior to Visit  Medication Sig Dispense Refill  . atenolol (TENORMIN) 50 MG tablet 1 tab po qd prn anxiety 90 tablet 1  . cetirizine (ZYRTEC) 10 MG tablet Take 10 mg by mouth daily.    . citalopram (CELEXA) 40 MG tablet Take 1 tablet (40 mg total) by mouth daily. 90 tablet 1  . omeprazole (PRILOSEC) 20 MG capsule Take 1 capsule (20 mg total) by mouth daily. 90 capsule 1   No facility-administered medications prior to visit.   PE: Blood pressure 127/84, pulse 82, temperature 97.7 F (36.5 C), temperature source Oral, resp. rate 16, weight 252 lb (114.306 kg), SpO2 96 %. Gen: Alert, well appearing.  Patient is oriented to person, place, time, and situation. L EAC clear, with normal TM visible. R EAC with erythema, flaking of epithelium.  No swelling of EAC, no exudate. TM with good light reflex and landmarks, no fluid in middle ear  IMPRESSION AND PLAN:  Right acute otitis externa: cortisporin otic drops, 4 gtts R ear qid x 7-10d. Swimmer's ear precautions discussed.  An After Visit Summary was printed and given to the patient.  FOLLOW UP: prn

## 2015-06-09 NOTE — Progress Notes (Signed)
Pre visit review using our clinic review tool, if applicable. No additional management support is needed unless otherwise documented below in the visit note. 

## 2015-06-10 NOTE — Telephone Encounter (Signed)
error 

## 2016-01-16 ENCOUNTER — Ambulatory Visit (INDEPENDENT_AMBULATORY_CARE_PROVIDER_SITE_OTHER): Payer: BLUE CROSS/BLUE SHIELD | Admitting: Family Medicine

## 2016-01-16 ENCOUNTER — Encounter: Payer: Self-pay | Admitting: Family Medicine

## 2016-01-16 VITALS — BP 127/85 | HR 79 | Temp 98.0°F | Resp 16 | Ht 69.0 in | Wt 251.2 lb

## 2016-01-16 DIAGNOSIS — F411 Generalized anxiety disorder: Secondary | ICD-10-CM

## 2016-01-16 DIAGNOSIS — G4733 Obstructive sleep apnea (adult) (pediatric): Secondary | ICD-10-CM

## 2016-01-16 DIAGNOSIS — R748 Abnormal levels of other serum enzymes: Secondary | ICD-10-CM

## 2016-01-16 DIAGNOSIS — Z9989 Dependence on other enabling machines and devices: Secondary | ICD-10-CM

## 2016-01-16 DIAGNOSIS — J039 Acute tonsillitis, unspecified: Secondary | ICD-10-CM | POA: Diagnosis not present

## 2016-01-16 DIAGNOSIS — K219 Gastro-esophageal reflux disease without esophagitis: Secondary | ICD-10-CM

## 2016-01-16 MED ORDER — OMEPRAZOLE 20 MG PO CPDR
20.0000 mg | DELAYED_RELEASE_CAPSULE | Freq: Every day | ORAL | Status: DC
Start: 2016-01-16 — End: 2017-01-20

## 2016-01-16 NOTE — Progress Notes (Signed)
Pre visit review using our clinic review tool, if applicable. No additional management support is needed unless otherwise documented below in the visit note. 

## 2016-01-16 NOTE — Progress Notes (Signed)
OFFICE VISIT  01/16/2016   CC:  Chief Complaint  Patient presents with  . Follow-up    Pt is not fasting.    HPI:    Patient is a 40 y.o. Caucasian male who presents for 6 mo f/u GAD with depression, GERD, and OSA. Hx of persistently elevated Alk phos as well.  As of last lab check 10 mo ago we had planned on getting abd u/s to eval biliary system, but he never got back to me about where he wanted this set up.  Had persistent blepharospasm about 5 mo ago, he figured out that taking 1/2 of his 40 mg cital made this go away.  Has been having URI problems lately, most recently 3 days of ST, fatigue, feverish, also noted exudative tonsillitis.  An MD friend rx'd him augmentin and he feels much improved in the last 1-2d.  Stopped propranolol due to ED.  Problem resolved when he stopped this med.  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  He remains on CPAP, needs to arrange routine pulm f/u. He is very happy with response to using CPAP. GERD is well controlled on 77m qd omeprazole.   Past Medical History  Diagnosis Date  . GAD (generalized anxiety disorder)     pt will be on citalopram indefinitely  . History of depression   . GERD (gastroesophageal reflux disease)     daily prilosec OTC helps  . Hay fever   . External hemorrhoid, bleeding   . OSA on CPAP 08/2014    Followed by Dr. CGwenette Greet   Past Surgical History  Procedure Laterality Date  . Wisdom tooth extraction  1998    Outpatient Prescriptions Prior to Visit  Medication Sig Dispense Refill  . cetirizine (ZYRTEC) 10 MG tablet Take 10 mg by mouth daily.    . citalopram (CELEXA) 40 MG tablet Take 1 tablet (40 mg total) by mouth daily. 90 tablet 1  . omeprazole (PRILOSEC) 20 MG capsule Take 1 capsule (20 mg total) by mouth daily. 90 capsule 1  . atenolol (TENORMIN) 50 MG tablet 1 tab po qd prn anxiety (Patient not taking: Reported on 01/16/2016) 90 tablet 1  .  neomycin-polymyxin-hydrocortisone (CORTISPORIN) otic solution Place 4 drops into the right ear 4 (four) times daily. (Patient not taking: Reported on 01/16/2016) 10 mL 0   No facility-administered medications prior to visit.    No Known Allergies  ROS As per HPI  PE: Blood pressure 127/85, pulse 79, temperature 98 F (36.7 C), temperature source Oral, resp. rate 16, height 5' 9" (1.753 m), weight 251 lb 4 oz (113.966 kg), SpO2 95 %. EOVZ:CHYI no injection, icteris, swelling, or exudate.  EOMI, PERRLA. Mouth: lips without lesion/swelling.  Oral mucosa pink and moist. Oropharynx exam shows a symmetric enlargement of tonsils, erythema, and exudate.   Neck - No masses or thyromegaly or limitation in range of motion CV: RRR, no m/r/g.   LUNGS: CTA bilat, nonlabored resps, good aeration in all lung fields. ABD: soft, NT/ND EXT: no clubbing, cyanosis, or edema.   LABS:  None today    Chemistry      Component Value Date/Time   NA 138 03/06/2015 1610   K 4.1 03/06/2015 1610   CL 104 03/06/2015 1610   CO2 31 03/06/2015 1610   BUN 17 03/06/2015 1610   CREATININE 1.00 03/06/2015 1610      Component Value Date/Time   CALCIUM 9.8 03/06/2015 1610   ALKPHOS 142* 03/06/2015  1610   AST 23 03/06/2015 1610   ALT 41 03/06/2015 1610   BILITOT 1.1 03/06/2015 1610      IMPRESSION AND PLAN:  1) GAD/depression: stable on 1/2 of 40 mg citalopram qd. He had persistent blepharospasm on 48m qd dosing. Intolerant to propranolol---ED.  Resolved off this med.  2) Hx of isolated (+asymptomatic) elevation of alkaline phosphatase. He forgot to call to help [place] u[place]s. We'll wait until after his board exams at the end of April and then we'll call him to set this up.  No repeat labs done today.  3) GERD: The current medical regimen is effective;  continue present plan and medications.  4) OSA: doing great on CPAP.  He'll call pulm clinic for a routine f/u visit.  5) Exudative tonsillitis:  resolving with augmentin.  Finish full 10d course of this med.  An After Visit Summary was printed and given to the patient.   FOLLOW UP: Return for 6-12 mo for fasting CPE.

## 2016-06-28 ENCOUNTER — Other Ambulatory Visit: Payer: Self-pay | Admitting: *Deleted

## 2016-06-28 MED ORDER — CITALOPRAM HYDROBROMIDE 40 MG PO TABS: 40.0000 mg | ORAL_TABLET | Freq: Every day | ORAL | Status: DC

## 2016-06-28 NOTE — Telephone Encounter (Signed)
RF request for citalopram LOV: 01/16/16 Next ov: None Last written: 04/24/15 #90 w/ 1RF

## 2017-01-07 ENCOUNTER — Telehealth: Payer: Self-pay | Admitting: *Deleted

## 2017-01-07 MED ORDER — CITALOPRAM HYDROBROMIDE 40 MG PO TABS: 40.0000 mg | ORAL_TABLET | Freq: Every day | ORAL | 0 refills | Status: DC

## 2017-01-07 NOTE — Telephone Encounter (Signed)
[facility].  RF request for citalopram LOV: 01/16/16 Next ov: None Last written: 06/28/16 #90 w/ 1RF  Rx sent for #90 w/ 0RF. Pt is due for CPE or follow up [facility].

## 2017-01-07 NOTE — Telephone Encounter (Signed)
Left message on cell vm for pt to call back.  

## 2017-01-11 NOTE — Telephone Encounter (Signed)
Left message for pt to call back  °

## 2017-01-11 NOTE — Telephone Encounter (Signed)
Pt advised and voiced understanding.   

## 2017-01-20 ENCOUNTER — Other Ambulatory Visit: Payer: Self-pay | Admitting: *Deleted

## 2017-01-20 MED ORDER — OMEPRAZOLE 20 MG PO CPDR: 20.0000 mg | DELAYED_RELEASE_CAPSULE | Freq: Every day | ORAL | 0 refills | Status: DC

## 2017-01-20 NOTE — Telephone Encounter (Signed)
[facility].  RF request for omeprazole LOV: 01/16/16 Next ov: None Last written: 01/16/16 #90 w/ 3Rf  Will send #90 w/ 0RF. Pt is due for f/u [facility], needs office visit for more refills. Pt was advised on 01/07/17 when another medication was refilled.

## 2017-03-17 ENCOUNTER — Ambulatory Visit (INDEPENDENT_AMBULATORY_CARE_PROVIDER_SITE_OTHER): Payer: 59 | Admitting: Family Medicine

## 2017-03-17 ENCOUNTER — Encounter: Payer: Self-pay | Admitting: Family Medicine

## 2017-03-17 VITALS — BP 121/91 | HR 64 | Temp 97.0°F | Resp 20 | Wt 228.5 lb

## 2017-03-17 DIAGNOSIS — Z0000 Encounter for general adult medical examination without abnormal findings: Secondary | ICD-10-CM | POA: Diagnosis not present

## 2017-03-17 MED ORDER — OMEPRAZOLE 20 MG PO CPDR: 20.0000 mg | DELAYED_RELEASE_CAPSULE | Freq: Every day | ORAL | 3 refills | Status: DC

## 2017-03-17 MED ORDER — CITALOPRAM HYDROBROMIDE 40 MG PO TABS: 40.0000 mg | ORAL_TABLET | Freq: Every day | ORAL | 3 refills | Status: DC

## 2017-03-17 NOTE — Progress Notes (Signed)
OFFICE VISIT  03/17/2017   CC:  Chief Complaint  Patient presents with  . Anxiety    follow up for medication refill   HPI:    Patient is a 41 y.o. Caucasian male who presents for annual health maintenance exam. He is fasting.  Feeling well, feels like anxiety/dep and GERD all stable  Occ bp check at home: 120s-130s/70s-80s.  Dieting well lately, has lost lots of weight. Plans on starting exercise soon.  Past Medical History:  Diagnosis Date  . Elevated alkaline phosphatase level   . External hemorrhoid, bleeding   . GAD (generalized anxiety disorder)    pt will be on citalopram indefinitely  . GERD (gastroesophageal reflux disease)    daily prilosec OTC helps  . Hay fever   . History of depression   . OSA on CPAP 08/2014   Followed by Dr. Gwenette Greet    Past Surgical History:  Procedure Laterality Date  . WISDOM TOOTH EXTRACTION  1998   Family History  Problem Relation Age of Onset  . Arthritis Mother   . Arthritis Father   . Hyperlipidemia Father   . Hypertension Father   . Mental illness Father     Frontotemporal degeneration  . Diabetes Father   . Stroke Maternal Grandfather   . Hypertension Paternal Grandfather   . Mental illness Paternal Grandfather     Multi-infarct dementia  . Diabetes Paternal Grandfather    Social History   Social History Narrative   Married, 4 children.   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  Residency--[employer]).   No tobacco.  Occ alcohol.  No drug use/abuse.   No exercise.    MEDS: not taking atenolol or augmentin listed below Outpatient Medications Prior to Visit  Medication Sig Dispense Refill  . cetirizine (ZYRTEC) 10 MG tablet Take 10 mg by mouth daily.    . citalopram (CELEXA) 40 MG tablet Take 1 tablet (40 mg total) by mouth daily. 90 tablet 0  . omeprazole (PRILOSEC) 20 MG capsule Take 1 capsule (20 mg total) by mouth daily. 90 capsule 0  . amoxicillin-clavulanate  (AUGMENTIN) 875-125 MG tablet Take 1 tablet by mouth 2 (two) times daily.    Marland Kitchen atenolol (TENORMIN) 50 MG tablet Take 50 mg by mouth daily as needed.     No facility-administered medications prior to visit.     No Known Allergies  ROS Review of Systems  Constitutional: Negative for appetite change, chills, fatigue and fever.  HENT: Negative for congestion, dental problem, ear pain and sore throat.   Eyes: Negative for discharge, redness and visual disturbance.  Respiratory: Negative for cough, chest tightness, shortness of breath and wheezing.   Cardiovascular: Negative for chest pain, palpitations and leg swelling.  Gastrointestinal: Negative for abdominal pain, blood in stool, diarrhea, nausea and vomiting.  Genitourinary: Negative for difficulty urinating, dysuria, flank pain, frequency, hematuria and urgency.  Musculoskeletal: Negative for arthralgias, back pain, joint swelling, myalgias and neck stiffness.  Skin: Negative for pallor and rash.  Neurological: Negative for dizziness, speech difficulty, weakness and headaches.  Hematological: Negative for adenopathy. Does not bruise/bleed easily.  Psychiatric/Behavioral: Negative for confusion and sleep disturbance. The patient is not nervous/anxious.      PE: Blood pressure (!) 121/91, pulse 64, temperature 97 F (36.1 C), resp. rate 20, weight 228 lb 8 oz (103.6 kg), SpO2 97 %.   Gen: Alert, well appearing.  Patient is oriented to person, place, time, and situation. AFFECT: pleasant, lucid thought and  speech. ENT: Ears: EACs clear, normal epithelium.  TMs with good light reflex and landmarks bilaterally.  Eyes: no injection, icteris, swelling, or exudate.  EOMI, PERRLA. Nose: no drainage or turbinate edema/swelling.  No injection or focal lesion.  Mouth: lips without lesion/swelling.  Oral mucosa pink and moist.  Dentition intact and without obvious caries or gingival swelling.  Oropharynx without erythema, exudate, or swelling.   Neck: supple/nontender.  No LAD, mass, or TM.  CV: RRR, no m/r/g.   LUNGS: CTA bilat, nonlabored resps, good aeration in all lung fields. ABD: soft, NT, ND, BS normal.  No hepatospenomegaly or mass.  No bruits. EXT: no clubbing, cyanosis, or edema.  Musculoskeletal: no joint swelling, erythema, warmth, or tenderness.  ROM of all joints intact. Skin - no sores or suspicious lesions or rashes or color changes  LABS:  Lab Results  Component Value Date   TSH 2.46 10/14/2014   Lab Results  Component Value Date   WBC 7.7 10/14/2014   HGB 16.4 10/14/2014   HCT 48.5 10/14/2014   MCV 94.9 10/14/2014   PLT 231.0 10/14/2014   Lab Results  Component Value Date   CREATININE 1.00 03/06/2015   BUN 17 03/06/2015   NA 138 03/06/2015   K 4.1 03/06/2015   CL 104 03/06/2015   CO2 31 03/06/2015   Lab Results  Component Value Date   ALT 41 03/06/2015   AST 23 03/06/2015   ALKPHOS 142 (H) 03/06/2015   BILITOT 1.1 03/06/2015   Lab Results  Component Value Date   CHOL 189 10/14/2014   Lab Results  Component Value Date   HDL 28.90 (L) 10/14/2014   Lab Results  Component Value Date   LDLCALC 125 (H) 10/14/2014   Lab Results  Component Value Date   TRIG 174.0 (H) 10/14/2014   Lab Results  Component Value Date   CHOLHDL 7 10/14/2014   IMPRESSION AND PLAN:  Health maintenance exam: Reviewed age and gender appropriate health maintenance issues (prudent diet, regular exercise, health risks of tobacco and excessive alcohol, use of seatbelts, fire alarms in home, use of sunscreen).  Also reviewed age and gender appropriate health screening as well as vaccine recommendations. Fasting HP labs drawn today. If alk phos is still elevated, will order abd u/s at [facility] radiology. Refilled citalopram and omeprazole today.  An After Visit Summary was printed and given to the patient.  FOLLOW UP: Return in about 1 year (around 03/17/2018) for annual CPE (fasting).  Signed:  Crissie Sickles,  MD           03/17/2017

## 2017-03-18 LAB — CBC WITH DIFFERENTIAL/PLATELET
BASOS ABS: 0 10*3/uL (ref 0.0–0.1)
Basophils Relative: 0.6 % (ref 0.0–3.0)
EOS ABS: 0.3 10*3/uL (ref 0.0–0.7)
Eosinophils Relative: 4 % (ref 0.0–5.0)
HEMATOCRIT: 44 % (ref 39.0–52.0)
HEMOGLOBIN: 15.7 g/dL (ref 13.0–17.0)
LYMPHS PCT: 26.7 % (ref 12.0–46.0)
Lymphs Abs: 2.3 10*3/uL (ref 0.7–4.0)
MCHC: 35.8 g/dL (ref 30.0–36.0)
MCV: 90.7 fl (ref 78.0–100.0)
MONOS PCT: 9.3 % (ref 3.0–12.0)
Monocytes Absolute: 0.8 10*3/uL (ref 0.1–1.0)
Neutro Abs: 5.1 10*3/uL (ref 1.4–7.7)
Neutrophils Relative %: 59.4 % (ref 43.0–77.0)
Platelets: 233 10*3/uL (ref 150.0–400.0)
RBC: 4.85 Mil/uL (ref 4.22–5.81)
RDW: 13 % (ref 11.5–15.5)
WBC: 8.6 10*3/uL (ref 4.0–10.5)

## 2017-03-18 LAB — LIPID PANEL
CHOL/HDL RATIO: 4
CHOLESTEROL: 150 mg/dL (ref 0–200)
HDL: 40.2 mg/dL (ref >39.00)
LDL CALC: 87 mg/dL (ref 0–99)
NonHDL: 110.1
TRIGLYCERIDES: 115 mg/dL (ref 0.0–149.0)
VLDL: 23 mg/dL (ref 0.0–40.0)

## 2017-03-18 LAB — COMPREHENSIVE METABOLIC PANEL
ALBUMIN: 4.4 g/dL (ref 3.5–5.2)
ALT: 23 U/L (ref 0–53)
AST: 23 U/L (ref 0–37)
Alkaline Phosphatase: 99 U/L (ref 39–117)
BILIRUBIN TOTAL: 1.7 mg/dL — AB (ref 0.2–1.2)
BUN: 11 mg/dL (ref 6–23)
CALCIUM: 9.5 mg/dL (ref 8.4–10.5)
CHLORIDE: 104 meq/L (ref 96–112)
CO2: 27 meq/L (ref 19–32)
CREATININE: 0.86 mg/dL (ref 0.40–1.50)
GFR: 105.57 mL/min (ref >60.00)
Glucose, Bld: 71 mg/dL (ref 70–99)
Potassium: 4 mEq/L (ref 3.5–5.1)
Sodium: 139 mEq/L (ref 135–145)
Total Protein: 7 g/dL (ref 6.0–8.3)

## 2017-03-18 LAB — TSH: TSH: 1.33 u[IU]/mL (ref 0.35–4.50)

## 2017-03-20 ENCOUNTER — Other Ambulatory Visit: Payer: Self-pay | Admitting: Family Medicine

## 2017-03-20 DIAGNOSIS — R748 Abnormal levels of other serum enzymes: Secondary | ICD-10-CM

## 2017-03-28 ENCOUNTER — Ambulatory Visit (HOSPITAL_COMMUNITY): Payer: 59

## 2017-04-01 ENCOUNTER — Encounter: Payer: Self-pay | Admitting: Family Medicine

## 2017-04-01 ENCOUNTER — Ambulatory Visit (HOSPITAL_BASED_OUTPATIENT_CLINIC_OR_DEPARTMENT_OTHER)
Admission: RE | Admit: 2017-04-01 | Discharge: 2017-04-01 | Disposition: A | Discharge status: Home / Self Care | Payer: 59 | Source: Ambulatory Visit | Attending: Family Medicine | Admitting: Family Medicine

## 2017-04-01 DIAGNOSIS — R748 Abnormal levels of other serum enzymes: Secondary | ICD-10-CM | POA: Diagnosis not present

## 2017-06-13 ENCOUNTER — Ambulatory Visit (INDEPENDENT_AMBULATORY_CARE_PROVIDER_SITE_OTHER): Payer: 59 | Admitting: Family Medicine

## 2017-06-13 ENCOUNTER — Encounter: Payer: Self-pay | Admitting: Family Medicine

## 2017-06-13 VITALS — BP 108/71 | HR 85 | Temp 98.3°F | Resp 16 | Ht 69.0 in | Wt 234.2 lb

## 2017-06-13 DIAGNOSIS — L0231 Cutaneous abscess of buttock: Secondary | ICD-10-CM | POA: Diagnosis not present

## 2017-06-13 MED ORDER — MUPIROCIN 2 % EX OINT: TOPICAL_OINTMENT | CUTANEOUS | 1 refills | Status: DC

## 2017-06-13 MED ORDER — DOXYCYCLINE HYCLATE 100 MG PO CAPS: 100.0000 mg | ORAL_CAPSULE | Freq: Two times a day (BID) | ORAL | 0 refills | Status: AC

## 2017-06-13 NOTE — Progress Notes (Signed)
OFFICE VISIT  06/13/2017   CC:  Chief Complaint  Patient presents with  . Abscess on lower back   HPI:    Patient is a 41 y.o. Caucasian male who presents for localized soft tissue swelling worrisome for infected cyst/abscess. He got poison ivy in area of right LB glut a couple weeks ago, was scratching the area a lot, then 3 d/a he noted the area swell, turn red, and start hurting---esp when he sat on the area.  No fevers, no malaise. Tiny drainage tract developed, he and wife expressed a moderate amount of pus, it sealed up again and is still in need of drainage.  An MD friend of his recently prescribed him keflex (x 2 days).   Past Medical History:  Diagnosis Date  . Elevated alkaline phosphatase level    Abd u/s normal 04/01/17  . External hemorrhoid, bleeding   . GAD (generalized anxiety disorder)    pt will be on citalopram indefinitely  . GERD (gastroesophageal reflux disease)    daily prilosec OTC helps  . Hay fever   . History of depression   . OSA on CPAP 08/2014   Followed by Dr. Shelle Iron    Past Surgical History:  Procedure Laterality Date  . WISDOM TOOTH EXTRACTION  1998    Outpatient Medications Prior to Visit  Medication Sig Dispense Refill  . cetirizine (ZYRTEC) 10 MG tablet Take 10 mg by mouth daily.    . citalopram (CELEXA) 40 MG tablet Take 1 tablet (40 mg total) by mouth daily. 90 tablet 3  . omeprazole (PRILOSEC) 20 MG capsule Take 1 capsule (20 mg total) by mouth daily. 90 capsule 3   No facility-administered medications prior to visit.     No Known Allergies  ROS As per HPI  PE: Blood pressure 108/71, pulse 85, temperature 98.3 F (36.8 C), temperature source Oral, resp. rate 16, height 5\' 9"  (1.753 m), weight 234 lb 4 oz (106.3 kg), SpO2 98 %. Gen: Alert, well appearing.  Patient is oriented to person, place, time, and situation. AFFECT: pleasant, lucid thought and speech. Right upper gluteal region with lemon sized erythematous soft tissue  swelling, indurated and edematous-feeling, with a mild amount of fluctuance felt.  One similar  Appearing/feeling 2 cm lesion about 3 cm superior to the larger lesion. Both areas are tender to palpation.  No streaking.  LABS:  none  IMPRESSION AND PLAN:  Right gluteal soft tissue abscess x 2, one large and one small: Incision and drainage done on both lesions today.  Procedure: Incision and drainage of abscess/cyst.  The indication for the procedure was explained to the patient, benefits and risks of procedure were outlined for patient, patient agreed to proceed.  Steps of the procedure were clearly explained to the patient prior to starting. Injected lesion with 5 ml of 2% lidocaine with epi for local anesthesia.  Incised central portion of each lesion with scalpel (elliptical defect)and used manual pressure and hemostats to express contents and encourage complete drainage.  Culture swab of lesion contents obtained and sent to lab.  The large wound was packed with iodoform gauze and dressed.  No significant bleeding.  Patient tolerated procedure well.  No immediate complications.  Wound care instructions given.  Warning signs of infection discussed. Follow up discussed.  Call or return for problems. D/C keflex.  Will cover for MRSA--doxycycline 100 mg bid x 7d rx'd. Also rx'd bactroban ointment to apply tid x 10d.  An After Visit Summary was printed  and given to the patient.  FOLLOW UP: Return if symptoms worsen or fail to improve.  Signed:  Santiago BumpersPhil McGowen, MD           06/13/2017

## 2017-06-16 LAB — WOUND CULTURE
GRAM STAIN: NONE SEEN
Gram Stain: NONE SEEN

## 2017-12-12 DIAGNOSIS — G4733 Obstructive sleep apnea (adult) (pediatric): Secondary | ICD-10-CM | POA: Diagnosis not present

## 2018-02-09 ENCOUNTER — Ambulatory Visit: Payer: 59 | Admitting: Family Medicine

## 2018-02-27 ENCOUNTER — Ambulatory Visit: Payer: Self-pay | Admitting: Family Medicine

## 2018-02-28 ENCOUNTER — Encounter: Payer: Self-pay | Admitting: Family Medicine

## 2018-02-28 ENCOUNTER — Ambulatory Visit (INDEPENDENT_AMBULATORY_CARE_PROVIDER_SITE_OTHER): Payer: BLUE CROSS/BLUE SHIELD | Admitting: Family Medicine

## 2018-02-28 VITALS — BP 145/73 | HR 78 | Temp 98.2°F | Resp 16 | Ht 69.0 in | Wt 245.2 lb

## 2018-02-28 DIAGNOSIS — M7711 Lateral epicondylitis, right elbow: Secondary | ICD-10-CM | POA: Diagnosis not present

## 2018-02-28 DIAGNOSIS — M659 Synovitis and tenosynovitis, unspecified: Secondary | ICD-10-CM | POA: Diagnosis not present

## 2018-02-28 DIAGNOSIS — M65972 Unspecified synovitis and tenosynovitis, left ankle and foot: Secondary | ICD-10-CM

## 2018-02-28 DIAGNOSIS — L821 Other seborrheic keratosis: Secondary | ICD-10-CM | POA: Diagnosis not present

## 2018-02-28 MED ORDER — PREDNISONE 20 MG PO TABS: ORAL_TABLET | ORAL | 0 refills | Status: DC

## 2018-02-28 NOTE — Progress Notes (Signed)
OFFICE VISIT  02/28/2018   CC:  Chief Complaint  Patient presents with  . Skin lesion    on back  . Joint Pain    right elbow and left ankle  . Referral    pulmonology    HPI:    Patient is a 42 y.o. Caucasian male who presents for skin lesion on his back, right elbow pain, and left ankle pain.  Skin lesion on R mid back -"mole" noted a week ago by pt's wife.  No pain or itching. No hx of atypical nevus or skin cancer of any kind.  R elbow pain onset after riding motor scooter device in [place] about 10-14 d/a.  He points to a focal area at the lateral epicondyle of R elbow.  No hx of trauma.  Hurts with supination.  He got a tennis elbow strap but has not used it yet. NSAIDs cause too much GI upset.  L ankle pain in lateral aspect for about 2 wks now, no preceding injury.  No swelling or redness.  Worse with touching the area or walking a lot.  No ankle joint or foot/toes pain.  He brought in labs from recent evaluation for disability insurance that were done 01/03/18 and repeated 01/16/18. FLP, HbA1c, BUN/Cr, urine testing all great, HIV screen NEG.  Hepatic panel normal except T bili barely elevated at 1.6 initially, and 1.7 on repeat.  Pt states he had been fasting for nearly 24h prior to his tests. No prior hx of elevated bilirubin, but alk phos has been mildly elevated in the past and we did a limited RUQ u/s which was normal.  Of note, alk phos on recent tests noted above NORMAL. A look back at the last labs I did on him 03/17/17 does show a T bili of 1.7, otherwise normal CMET.  Past Medical History:  Diagnosis Date  . Elevated alkaline phosphatase level    Abd u/s normal 04/01/17  . External hemorrhoid, bleeding   . GAD (generalized anxiety disorder)    pt will be on citalopram indefinitely  . GERD (gastroesophageal reflux disease)    daily prilosec OTC helps  . Hay fever   . History of depression   . OSA on CPAP 08/2014   Followed by Dr. Gwenette Greet    Past  Surgical History:  Procedure Laterality Date  . [place]    Outpatient Medications Prior to Visit  Medication Sig Dispense Refill  . cetirizine (ZYRTEC) 10 MG tablet Take 10 mg by mouth daily.    . citalopram (CELEXA) 40 MG tablet Take 1 tablet (40 mg total) by mouth daily. 90 tablet 3  . omeprazole (PRILOSEC) 20 MG capsule Take 1 capsule (20 mg total) by mouth daily. 90 capsule 3  . mupirocin ointment (BACTROBAN) 2 % Apply to affected area tid x 10d (Patient not taking: Reported on 02/28/2018) 15 g 1   No facility-administered medications prior to visit.     No Known Allergies  ROS As per HPI  PE: Blood pressure (!) 145/73, pulse 78, temperature 98.2 F (36.8 C), temperature source Oral, resp. rate 16, height 5' 9"  (1.753 m), weight 245 lb 4 oz (111.2 kg), SpO2 98 %. Gen: Alert, well appearing.  Patient is oriented to person, place, time, and situation. AFFECT: pleasant, lucid thought and speech. SKIN: R mid back with 1 cm oblong shaped, light brown, flat papule with hyperkeratosis/superficial crusting.  Appears "stuck on". Right elbow: focal TTP at insertion of extensor  complex on lateral epicondyle.  Pain in the area worsens with resisted wrist extension and resisted forearm supination.   Left ankle: no swelling, warmth, or erythema.  Achilles tendon and heel and plantar surface are w/out tenderness. ROM and strength intact in L ankle.  He has mild tenderness to palpation in area of peroneal tendon sheath as it courses inferior/posterior to lateral malleolus.  No bony tenderness, no deformity.  ROM intact, with minimal excess laxity with talar tilt.   LABS:    Chemistry      Component Value Date/Time   NA 139 03/17/2017 1608   K 4.0 03/17/2017 1608   CL 104 03/17/2017 1608   CO2 27 03/17/2017 1608   BUN 11 03/17/2017 1608   CREATININE 0.86 03/17/2017 1608      Component Value Date/Time   CALCIUM 9.5 03/17/2017 1608   ALKPHOS 99 03/17/2017 1608   AST  23 03/17/2017 1608   ALT 23 03/17/2017 1608   BILITOT 1.7 (H) 03/17/2017 1608      IMPRESSION AND PLAN:  1) Seborrheic keratosis lesion on R midback.  Reassured, no treatment necessary.  2) Left ankle: tenosynovitis of penoneal tendon sheath suspected. Prednisone 30m qd x 5d. If not improving with this, will have him wear a walking boot to immobilize for 7-10d to see if it calms down.  3) Right elbow lateral epicondylitis.   Start wearing tennis elbow strap, apply ice, relative rest.  We'll see how the prednisone for his tenosynovitis of ankle helps this.  Will reserve topical NSAID for later if not getting better appropriately.  Pt doesn't tolerate oral NSAIDs--GI upset/dyspepsia.  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 Will recheck this test at upcoming fasting CPE (he'll fast only 12 h this time) to make sure no trend upward.  An After Visit Summary was printed and given to the patient.  FOLLOW UP: Return for appt for fasting CPE at patient's convenience.  Signed:  PCrissie Sickles MD           02/28/2018

## 2018-04-26 ENCOUNTER — Other Ambulatory Visit: Payer: Self-pay | Admitting: Family Medicine

## 2018-10-31 ENCOUNTER — Ambulatory Visit (INDEPENDENT_AMBULATORY_CARE_PROVIDER_SITE_OTHER): Payer: BLUE CROSS/BLUE SHIELD

## 2018-10-31 DIAGNOSIS — Z23 Encounter for immunization: Secondary | ICD-10-CM | POA: Diagnosis not present

## 2018-12-13 ENCOUNTER — Other Ambulatory Visit: Payer: Self-pay | Admitting: Family Medicine

## 2018-12-27 ENCOUNTER — Other Ambulatory Visit: Payer: Self-pay

## 2018-12-27 ENCOUNTER — Encounter (HOSPITAL_BASED_OUTPATIENT_CLINIC_OR_DEPARTMENT_OTHER): Payer: Self-pay | Admitting: Emergency Medicine

## 2018-12-27 ENCOUNTER — Emergency Department (HOSPITAL_BASED_OUTPATIENT_CLINIC_OR_DEPARTMENT_OTHER)
Admission: EM | Admit: 2018-12-27 | Discharge: 2018-12-27 | Disposition: A | Discharge status: Home / Self Care | Payer: BLUE CROSS/BLUE SHIELD | Attending: Emergency Medicine | Admitting: Emergency Medicine

## 2018-12-27 ENCOUNTER — Emergency Department (HOSPITAL_BASED_OUTPATIENT_CLINIC_OR_DEPARTMENT_OTHER): Payer: BLUE CROSS/BLUE SHIELD

## 2018-12-27 DIAGNOSIS — S42021A Displaced fracture of shaft of right clavicle, initial encounter for closed fracture: Secondary | ICD-10-CM

## 2018-12-27 DIAGNOSIS — S4991XA Unspecified injury of right shoulder and upper arm, initial encounter: Secondary | ICD-10-CM | POA: Diagnosis not present

## 2018-12-27 DIAGNOSIS — Y929 Unspecified place or not applicable: Secondary | ICD-10-CM | POA: Insufficient documentation

## 2018-12-27 DIAGNOSIS — Y999 Unspecified external cause status: Secondary | ICD-10-CM | POA: Diagnosis not present

## 2018-12-27 DIAGNOSIS — S4990XA Unspecified injury of shoulder and upper arm, unspecified arm, initial encounter: Secondary | ICD-10-CM

## 2018-12-27 DIAGNOSIS — Y9389 Activity, other specified: Secondary | ICD-10-CM | POA: Diagnosis not present

## 2018-12-27 MED ORDER — TRAMADOL HCL 50 MG PO TABS: 50.0000 mg | ORAL_TABLET | Freq: Four times a day (QID) | ORAL | 0 refills | Status: DC | PRN
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
## 2018-12-27 NOTE — Discharge Instructions (Addendum)
Wear sling as needed for comfort. Take Ultram as needed as prescribed for pain not controlled with Motrin and Tylenol. Follow-up with orthopedics as planned.  Return to ER for worsening or concerning symptoms.

## 2018-12-28 ENCOUNTER — Other Ambulatory Visit: Payer: Self-pay

## 2018-12-28 ENCOUNTER — Ambulatory Visit (HOSPITAL_COMMUNITY): Payer: BLUE CROSS/BLUE SHIELD | Admitting: Anesthesiology

## 2018-12-28 ENCOUNTER — Encounter (HOSPITAL_COMMUNITY): Payer: Self-pay

## 2018-12-28 ENCOUNTER — Ambulatory Visit (HOSPITAL_COMMUNITY): Payer: BLUE CROSS/BLUE SHIELD

## 2018-12-28 ENCOUNTER — Encounter (HOSPITAL_COMMUNITY)
Admission: RE | Disposition: A | Discharge status: Home / Self Care | Payer: Self-pay | Source: Home / Self Care | Attending: Orthopaedic Surgery

## 2018-12-28 ENCOUNTER — Ambulatory Visit (HOSPITAL_COMMUNITY)
Admission: RE | Admit: 2018-12-28 | Discharge: 2018-12-29 | Disposition: A | Discharge status: Home / Self Care | Payer: BLUE CROSS/BLUE SHIELD | Attending: Orthopaedic Surgery | Admitting: Orthopaedic Surgery

## 2018-12-28 DIAGNOSIS — S42021D Displaced fracture of shaft of right clavicle, subsequent encounter for fracture with routine healing: Secondary | ICD-10-CM | POA: Diagnosis not present

## 2018-12-28 DIAGNOSIS — K219 Gastro-esophageal reflux disease without esophagitis: Secondary | ICD-10-CM | POA: Diagnosis not present

## 2018-12-28 DIAGNOSIS — S42001A Fracture of unspecified part of right clavicle, initial encounter for closed fracture: Secondary | ICD-10-CM | POA: Diagnosis present

## 2018-12-28 DIAGNOSIS — G4733 Obstructive sleep apnea (adult) (pediatric): Secondary | ICD-10-CM | POA: Insufficient documentation

## 2018-12-28 DIAGNOSIS — S42021A Displaced fracture of shaft of right clavicle, initial encounter for closed fracture: Secondary | ICD-10-CM

## 2018-12-28 DIAGNOSIS — F411 Generalized anxiety disorder: Secondary | ICD-10-CM | POA: Diagnosis not present

## 2018-12-28 DIAGNOSIS — Z79899 Other long term (current) drug therapy: Secondary | ICD-10-CM | POA: Diagnosis not present

## 2018-12-28 DIAGNOSIS — Z419 Encounter for procedure for purposes other than remedying health state, unspecified: Secondary | ICD-10-CM

## 2018-12-28 HISTORY — PX: ORIF CLAVICULAR FRACTURE: SHX5055

## 2018-12-28 SURGERY — OPEN REDUCTION INTERNAL FIXATION (ORIF) CLAVICULAR FRACTURE
Anesthesia: General | Site: Shoulder | Laterality: Right

## 2018-12-28 MED ORDER — METHOCARBAMOL 500 MG PO TABS: 500.0000 mg | ORAL_TABLET | Freq: Four times a day (QID) | ORAL | Status: DC | PRN

## 2018-12-28 MED ORDER — HYDROMORPHONE HCL 1 MG/ML IJ SOLN: 0.2500 mg | INTRAMUSCULAR | Status: DC | PRN

## 2018-12-28 MED ORDER — EPHEDRINE 5 MG/ML INJ
INTRAVENOUS | Status: AC
  Filled 2018-12-28: qty 10

## 2018-12-28 MED ORDER — OXYCODONE HCL 5 MG PO TABS: 10.0000 mg | ORAL_TABLET | ORAL | Status: DC | PRN

## 2018-12-28 MED ORDER — CEFAZOLIN SODIUM-DEXTROSE 2-3 GM-%(50ML) IV SOLR
INTRAVENOUS | Status: DC | PRN
  Administered 2018-12-28: 2 g via INTRAVENOUS

## 2018-12-28 MED ORDER — FENTANYL CITRATE (PF) 250 MCG/5ML IJ SOLN
INTRAMUSCULAR | Status: AC
  Filled 2018-12-28: qty 5

## 2018-12-28 MED ORDER — BUPIVACAINE HCL (PF) 0.25 % IJ SOLN
INTRAMUSCULAR | Status: AC
  Filled 2018-12-28: qty 30

## 2018-12-28 MED ORDER — DIPHENHYDRAMINE HCL 12.5 MG/5ML PO ELIX: 12.5000 mg | ORAL_SOLUTION | ORAL | Status: DC | PRN

## 2018-12-28 MED ORDER — ROCURONIUM BROMIDE 50 MG/5ML IV SOSY
PREFILLED_SYRINGE | INTRAVENOUS | Status: DC | PRN
  Administered 2018-12-28 (×2): 20 mg via INTRAVENOUS
  Administered 2018-12-28 (×2): 50 mg via INTRAVENOUS

## 2018-12-28 MED ORDER — ACETAMINOPHEN 325 MG PO TABS
325.0000 mg | ORAL_TABLET | Freq: Four times a day (QID) | ORAL | Status: DC | PRN
  Administered 2018-12-28: 650 mg via ORAL
  Filled 2018-12-28: qty 2

## 2018-12-28 MED ORDER — METHOCARBAMOL 1000 MG/10ML IJ SOLN
500.0000 mg | Freq: Four times a day (QID) | INTRAVENOUS | Status: DC | PRN
  Filled 2018-12-28: qty 5

## 2018-12-28 MED ORDER — MIDAZOLAM HCL 5 MG/5ML IJ SOLN
INTRAMUSCULAR | Status: DC | PRN
  Administered 2018-12-28: 2 mg via INTRAVENOUS

## 2018-12-28 MED ORDER — METOCLOPRAMIDE HCL 5 MG/ML IJ SOLN: 5.0000 mg | Freq: Three times a day (TID) | INTRAMUSCULAR | Status: DC | PRN

## 2018-12-28 MED ORDER — PANTOPRAZOLE SODIUM 40 MG PO TBEC
40.0000 mg | DELAYED_RELEASE_TABLET | Freq: Every day | ORAL | Status: DC
  Administered 2018-12-28: 40 mg via ORAL
  Filled 2018-12-28: qty 1

## 2018-12-28 MED ORDER — PROPOFOL 10 MG/ML IV BOLUS
INTRAVENOUS | Status: AC
  Filled 2018-12-28: qty 20

## 2018-12-28 MED ORDER — HYDROMORPHONE HCL 1 MG/ML IJ SOLN: 0.5000 mg | INTRAMUSCULAR | Status: DC | PRN

## 2018-12-28 MED ORDER — PROMETHAZINE HCL 25 MG/ML IJ SOLN: 6.2500 mg | INTRAMUSCULAR | Status: DC | PRN

## 2018-12-28 MED ORDER — ONDANSETRON HCL 4 MG PO TABS: 4.0000 mg | ORAL_TABLET | Freq: Four times a day (QID) | ORAL | Status: DC | PRN

## 2018-12-28 MED ORDER — ROCURONIUM BROMIDE 50 MG/5ML IV SOSY
PREFILLED_SYRINGE | INTRAVENOUS | Status: AC
  Filled 2018-12-28: qty 5

## 2018-12-28 MED ORDER — SUGAMMADEX SODIUM 200 MG/2ML IV SOLN
INTRAVENOUS | Status: DC | PRN
  Administered 2018-12-28: 400 mg via INTRAVENOUS

## 2018-12-28 MED ORDER — MIDAZOLAM HCL 2 MG/2ML IJ SOLN
INTRAMUSCULAR | Status: AC
  Filled 2018-12-28: qty 2

## 2018-12-28 MED ORDER — CEFAZOLIN SODIUM 1 G IJ SOLR
INTRAMUSCULAR | Status: AC
  Filled 2018-12-28: qty 20

## 2018-12-28 MED ORDER — BUPIVACAINE HCL (PF) 0.25 % IJ SOLN
INTRAMUSCULAR | Status: DC | PRN
  Administered 2018-12-28: 10 mL

## 2018-12-28 MED ORDER — LIDOCAINE 2% (20 MG/ML) 5 ML SYRINGE
INTRAMUSCULAR | Status: DC | PRN
  Administered 2018-12-28: 60 mg via INTRAVENOUS

## 2018-12-28 MED ORDER — LIDOCAINE 2% (20 MG/ML) 5 ML SYRINGE
INTRAMUSCULAR | Status: AC
  Filled 2018-12-28: qty 5

## 2018-12-28 MED ORDER — ONDANSETRON HCL 4 MG/2ML IJ SOLN
INTRAMUSCULAR | Status: DC | PRN
  Administered 2018-12-28: 4 mg via INTRAVENOUS

## 2018-12-28 MED ORDER — ONDANSETRON HCL 4 MG/2ML IJ SOLN: 4.0000 mg | Freq: Four times a day (QID) | INTRAMUSCULAR | Status: DC | PRN

## 2018-12-28 MED ORDER — CEFAZOLIN SODIUM-DEXTROSE 1-4 GM/50ML-% IV SOLN
1.0000 g | Freq: Four times a day (QID) | INTRAVENOUS | Status: DC
  Administered 2018-12-28 – 2018-12-29 (×2): 1 g via INTRAVENOUS
  Filled 2018-12-28 (×3): qty 50

## 2018-12-28 MED ORDER — POLYETHYLENE GLYCOL 3350 17 G PO PACK: 17.0000 g | PACK | Freq: Every day | ORAL | Status: DC | PRN

## 2018-12-28 MED ORDER — HYDROMORPHONE HCL 1 MG/ML IJ SOLN
INTRAMUSCULAR | Status: AC
  Filled 2018-12-28: qty 1

## 2018-12-28 MED ORDER — PROPOFOL 10 MG/ML IV BOLUS
INTRAVENOUS | Status: DC | PRN
  Administered 2018-12-28: 200 mg via INTRAVENOUS

## 2018-12-28 MED ORDER — EPHEDRINE SULFATE-NACL 50-0.9 MG/10ML-% IV SOSY
PREFILLED_SYRINGE | INTRAVENOUS | Status: DC | PRN
  Administered 2018-12-28: 10 mg via INTRAVENOUS

## 2018-12-28 MED ORDER — SODIUM CHLORIDE 0.9 % IV SOLN
INTRAVENOUS | Status: DC
  Administered 2018-12-28: 21:00:00 via INTRAVENOUS

## 2018-12-28 MED ORDER — PHENYLEPHRINE 40 MCG/ML (10ML) SYRINGE FOR IV PUSH (FOR BLOOD PRESSURE SUPPORT)
PREFILLED_SYRINGE | INTRAVENOUS | Status: AC
  Filled 2018-12-28: qty 20

## 2018-12-28 MED ORDER — OXYCODONE HCL 5 MG PO TABS
5.0000 mg | ORAL_TABLET | ORAL | Status: DC | PRN
  Filled 2018-12-28: qty 2

## 2018-12-28 MED ORDER — DEXAMETHASONE SODIUM PHOSPHATE 10 MG/ML IJ SOLN
INTRAMUSCULAR | Status: DC | PRN
  Administered 2018-12-28: 10 mg via INTRAVENOUS

## 2018-12-28 MED ORDER — METOCLOPRAMIDE HCL 5 MG PO TABS: 5.0000 mg | ORAL_TABLET | Freq: Three times a day (TID) | ORAL | Status: DC | PRN

## 2018-12-28 MED ORDER — CITALOPRAM HYDROBROMIDE 20 MG PO TABS
20.0000 mg | ORAL_TABLET | Freq: Every day | ORAL | Status: DC
  Filled 2018-12-28: qty 1

## 2018-12-28 MED ORDER — LACTATED RINGERS IV SOLN
INTRAVENOUS | Status: DC
  Administered 2018-12-28 (×2): via INTRAVENOUS

## 2018-12-28 MED ORDER — 0.9 % SODIUM CHLORIDE (POUR BTL) OPTIME
TOPICAL | Status: DC | PRN
  Administered 2018-12-28: 1000 mL

## 2018-12-28 MED ORDER — FENTANYL CITRATE (PF) 100 MCG/2ML IJ SOLN
INTRAMUSCULAR | Status: DC | PRN
  Administered 2018-12-28: 100 ug via INTRAVENOUS
  Administered 2018-12-28 (×4): 50 ug via INTRAVENOUS

## 2018-12-28 SURGICAL SUPPLY — 61 items
BENZOIN TINCTURE PRP APPL 2/3 (GAUZE/BANDAGES/DRESSINGS) ×1 IMPLANT
BIT DRILL 2.0 (BIT) ×1 IMPLANT
COVER SURGICAL LIGHT HANDLE (MISCELLANEOUS) ×2 IMPLANT
COVER WAND RF STERILE (DRAPES) ×2 IMPLANT
DRAPE C-ARM 42X72 X-RAY (DRAPES) ×2 IMPLANT
DRAPE IMP U-DRAPE 54X76 (DRAPES) ×2 IMPLANT
DRAPE INCISE IOBAN 66X45 STRL (DRAPES) IMPLANT
DRAPE ORTHO SPLIT 77X108 STRL (DRAPES) ×1
DRAPE SURG 17X23 STRL (DRAPES) ×2 IMPLANT
DRAPE SURG ORHT 6 SPLT 77X108 (DRAPES) ×1 IMPLANT
DRAPE U-SHAPE 47X51 STRL (DRAPES) ×2 IMPLANT
DRILL 2.6X220MM LONG AO (BIT) ×1 IMPLANT
DRILL OVER 2.7X220 (BIT) ×1 IMPLANT
DRSG AQUACEL AG ADV 3.5X10 (GAUZE/BANDAGES/DRESSINGS) ×1 IMPLANT
DRSG EMULSION OIL 3X3 NADH (GAUZE/BANDAGES/DRESSINGS) ×2 IMPLANT
DURAPREP 26ML APPLICATOR (WOUND CARE) ×2 IMPLANT
ELECT REM PT RETURN 9FT ADLT (ELECTROSURGICAL) ×2
ELECTRODE REM PT RTRN 9FT ADLT (ELECTROSURGICAL) ×1 IMPLANT
GAUZE SPONGE 4X4 12PLY STRL (GAUZE/BANDAGES/DRESSINGS) ×2 IMPLANT
GLOVE BIO SURGEON STRL SZ8 (GLOVE) ×2 IMPLANT
GLOVE BIOGEL PI IND STRL 8 (GLOVE) ×1 IMPLANT
GLOVE BIOGEL PI INDICATOR 8 (GLOVE) ×1
GLOVE ORTHO TXT STRL SZ7.5 (GLOVE) ×2 IMPLANT
GOWN STRL REUS W/ TWL LRG LVL3 (GOWN DISPOSABLE) ×1 IMPLANT
GOWN STRL REUS W/ TWL XL LVL3 (GOWN DISPOSABLE) ×2 IMPLANT
GOWN STRL REUS W/TWL LRG LVL3 (GOWN DISPOSABLE) ×1
GOWN STRL REUS W/TWL XL LVL3 (GOWN DISPOSABLE) ×2
GUIDEWIRE UNCOATED ST S 7038 (WIRE) ×1 IMPLANT
KIT BASIN OR (CUSTOM PROCEDURE TRAY) ×2 IMPLANT
KIT TURNOVER KIT B (KITS) ×2 IMPLANT
MANIFOLD NEPTUNE II (INSTRUMENTS) ×2 IMPLANT
NDL HYPO 25GX1X1/2 BEV (NEEDLE) IMPLANT
NEEDLE HYPO 25GX1X1/2 BEV (NEEDLE) IMPLANT
NS IRRIG 1000ML POUR BTL (IV SOLUTION) ×2 IMPLANT
PACK SHOULDER (CUSTOM PROCEDURE TRAY) ×2 IMPLANT
PACK UNIVERSAL I (CUSTOM PROCEDURE TRAY) ×2 IMPLANT
PAD ARMBOARD 7.5X6 YLW CONV (MISCELLANEOUS) ×4 IMPLANT
PLATE BONE LOCK 10H 122 RT (Plate) ×1 IMPLANT
PUTTY DBM STAGRAFT PLUS 10CC (Putty) ×1 IMPLANT
SCREW BONE 2.7X16MM (Screw) ×3 IMPLANT
SCREW BONE 3.5X14MM (Screw) ×2 IMPLANT
SCREW BONE 3.5X18MM (Screw) ×1 IMPLANT
SCREW BONE 3.5X20MM (Screw) ×1 IMPLANT
SCREW CORTICAL 2.7X14MM (Screw) ×1 IMPLANT
SCREW LOCKING 12X3.5MM (Screw) ×1 IMPLANT
SCREW LOCKING 16MM (Screw) ×1 IMPLANT
SLING ARM FOAM STRAP XLG (SOFTGOODS) ×1 IMPLANT
SPONGE LAP 18X18 X RAY DECT (DISPOSABLE) ×4 IMPLANT
STRIP CLOSURE SKIN 1/2X4 (GAUZE/BANDAGES/DRESSINGS) ×2 IMPLANT
SUCTION FRAZIER HANDLE 10FR (MISCELLANEOUS) ×1
SUCTION TUBE FRAZIER 10FR DISP (MISCELLANEOUS) ×1 IMPLANT
SUT ETHILON 3 0 PS 1 (SUTURE) ×1 IMPLANT
SUT MNCRL AB 4-0 PS2 18 (SUTURE) ×2 IMPLANT
SUT PROLENE 3 0 PS 1 (SUTURE) ×1 IMPLANT
SUT VIC AB 0 CT1 27 (SUTURE) ×1
SUT VIC AB 0 CT1 27XBRD ANBCTR (SUTURE) IMPLANT
SUT VIC AB 2-0 CT1 27 (SUTURE) ×1
SUT VIC AB 2-0 CT1 TAPERPNT 27 (SUTURE) ×1 IMPLANT
SYR CONTROL 10ML LL (SYRINGE) IMPLANT
WATER STERILE IRR 1000ML POUR (IV SOLUTION) ×2 IMPLANT
YANKAUER SUCT BULB TIP NO VENT (SUCTIONS) ×2 IMPLANT

## 2018-12-28 NOTE — Brief Op Note (Signed)
12/28/2018  7:25 PM  PATIENT:  Daniel Cardenas  42 y.o. male  PRE-OPERATIVE DIAGNOSIS:  right clavicle fracture  POST-OPERATIVE DIAGNOSIS:  right clavicle fracture  PROCEDURE:  Procedure(s): OPEN REDUCTION INTERNAL FIXATION (ORIF) CLAVICULAR FRACTURE (Right)  SURGEON:  Surgeon(s) and Role:    Kathryne Hitch, MD - Primary  PHYSICIAN ASSISTANT: Rexene Edison, PA-C  ANESTHESIA:   local and general  EBL:  20 mL   COUNTS:  YES  TOURNIQUET:  * No tourniquets in log *  DICTATION: .Other Dictation: Dictation Number [id number]  PLAN OF CARE: Admit for overnight observation  PATIENT DISPOSITION:  PACU - hemodynamically stable.   Delay start of Pharmacological VTE agent (>24hrs) due to surgical blood loss or risk of bleeding: no

## 2018-12-28 NOTE — Anesthesia Procedure Notes (Signed)
Procedure Name: Intubation Date/Time: 12/28/2018 5:16 PM Performed by: Rosiland Oz, CRNA Pre-anesthesia Checklist: Patient identified, Emergency Drugs available, Suction available, Patient being monitored and Timeout performed Patient Re-evaluated:Patient Re-evaluated prior to induction Oxygen Delivery Method: Circle system utilized Preoxygenation: Pre-oxygenation with 100% oxygen Induction Type: IV induction Ventilation: Mask ventilation without difficulty and Oral airway inserted - appropriate to patient size Laryngoscope Size: Miller and 3 Grade View: Grade II Tube type: Oral Tube size: 7.5 mm Number of attempts: 1 Airway Equipment and Method: Stylet Placement Confirmation: ETT inserted through vocal cords under direct vision,  positive ETCO2 and breath sounds checked- equal and bilateral Secured at: 22 cm Tube secured with: Tape Dental Injury: Teeth and Oropharynx as per pre-operative assessment

## 2018-12-28 NOTE — Progress Notes (Signed)
Patient brought in Home CPAP and setup himself with Family at bedside for assistance. All wires looked in good shape no noticeable fraying to broken issues. Patient did not want sterile Water for machine at this time. Will call if any further assistance needed. Patient can place on and off himself.

## 2018-12-28 NOTE — Anesthesia Preprocedure Evaluation (Signed)
Anesthesia Evaluation  Patient identified by MRN, date of birth, ID band Patient awake    Reviewed: Allergy & Precautions, NPO status , Patient's Chart, lab work & pertinent test results  Airway Mallampati: I  TM Distance: >3 FB Neck ROM: Full    Dental  (+) Dental Advisory Given   Pulmonary sleep apnea ,    breath sounds clear to auscultation       Cardiovascular negative cardio ROS   Rhythm:Regular Rate:Normal     Neuro/Psych negative neurological ROS     GI/Hepatic Neg liver ROS, GERD  ,  Endo/Other  negative endocrine ROS  Renal/GU negative Renal ROS     Musculoskeletal   Abdominal   Peds  Hematology negative hematology ROS (+)   Anesthesia Other Findings   Reproductive/Obstetrics                             Anesthesia Physical Anesthesia Plan  ASA: II  Anesthesia Plan: General   Post-op Pain Management:    Induction: Intravenous  PONV Risk Score and Plan: 2 and Dexamethasone, Ondansetron and Treatment may vary due to age or medical condition  Airway Management Planned: Oral ETT  Additional Equipment:   Intra-op Plan:   Post-operative Plan: Extubation in OR  Informed Consent: I have reviewed the patients History and Physical, chart, labs and discussed the procedure including the risks, benefits and alternatives for the proposed anesthesia with the patient or authorized representative who has indicated his/her understanding and acceptance.   Dental advisory given  Plan Discussed with: CRNA  Anesthesia Plan Comments:         Anesthesia Quick Evaluation
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
## 2018-12-28 NOTE — Transfer of Care (Signed)
Immediate Anesthesia Transfer of Care Note  Patient: Daniel LoronJohn Cardenas  Procedure(s) Performed: OPEN REDUCTION INTERNAL FIXATION (ORIF) CLAVICULAR FRACTURE (Right Shoulder)  Patient Location: PACU  Anesthesia Type:General  Level of Consciousness: awake, alert  and oriented  Airway & Oxygen Therapy: Patient Spontanous Breathing and Patient connected to face mask oxygen  Post-op Assessment: Report given to RN and Post -op Vital signs reviewed and stable  Post vital signs: Reviewed and stable  Last Vitals:  Vitals Value Taken Time  BP    Temp    Pulse 100 12/28/2018  7:55 PM  Resp 20 12/28/2018  7:55 PM  SpO2 100 % 12/28/2018  7:55 PM  Vitals shown include unvalidated device data.  Last Pain:  Vitals:   12/28/18 1428  TempSrc: Oral         Complications: No apparent anesthesia complications

## 2018-12-29 DIAGNOSIS — G4733 Obstructive sleep apnea (adult) (pediatric): Secondary | ICD-10-CM | POA: Diagnosis not present

## 2018-12-29 DIAGNOSIS — S42021A Displaced fracture of shaft of right clavicle, initial encounter for closed fracture: Secondary | ICD-10-CM | POA: Diagnosis not present

## 2018-12-29 DIAGNOSIS — Z79899 Other long term (current) drug therapy: Secondary | ICD-10-CM | POA: Diagnosis not present

## 2018-12-29 DIAGNOSIS — F411 Generalized anxiety disorder: Secondary | ICD-10-CM | POA: Diagnosis not present

## 2018-12-29 DIAGNOSIS — K219 Gastro-esophageal reflux disease without esophagitis: Secondary | ICD-10-CM | POA: Diagnosis not present

## 2018-12-29 DIAGNOSIS — Z8781 Personal history of (healed) traumatic fracture: Secondary | ICD-10-CM

## 2018-12-29 HISTORY — DX: Personal history of (healed) traumatic fracture: Z87.81

## 2018-12-29 MED ORDER — HYDROCODONE-ACETAMINOPHEN 5-325 MG PO TABS: 1.0000 | ORAL_TABLET | Freq: Four times a day (QID) | ORAL | 0 refills | Status: DC | PRN
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
## 2018-12-29 NOTE — Progress Notes (Signed)
Patient ID: Daniel Cardenas, male   DOB: 10/06/1976, 43 y.o.   MRN: [id number] Looks good overall this am.  Dressing with scant bloody drainage.  Can be discharged to home this morning.

## 2018-12-29 NOTE — Anesthesia Postprocedure Evaluation (Signed)
Anesthesia Post Note  Patient: Daniel Cardenas  Procedure(s) Performed: OPEN REDUCTION INTERNAL FIXATION (ORIF) CLAVICULAR FRACTURE (Right Shoulder)     Patient location during evaluation: PACU Anesthesia Type: General Level of consciousness: awake and alert Pain management: pain level controlled Vital Signs Assessment: post-procedure vital signs reviewed and stable Respiratory status: spontaneous breathing, nonlabored ventilation, respiratory function stable and patient connected to nasal cannula oxygen Cardiovascular status: blood pressure returned to baseline and stable Postop Assessment: no apparent nausea or vomiting Anesthetic complications: no    Last Vitals:  Vitals:   12/29/18 0110 12/29/18 0433  BP: 131/62 125/67  Pulse: 100 93  Resp: 16 16  Temp: 37.2 C 37.8 C  SpO2: 96% 98%    Last Pain:  Vitals:   12/29/18 0433  TempSrc: Oral  PainSc:                  Chrystina Naff

## 2018-12-29 NOTE — Progress Notes (Signed)
Discharge paperwork gone over in detail with patient and wife. All questions answered to patients satisfaction. Pt states that he is not in any pain at this moment. IV removed intact. Pt walking around room and is dressed. Confirmed with Dr. Magnus Ivan that pt's prescriptions have been e-scribed to pt's pharmacy. Pt discharged to home with wife. VSS.    Delories Heinz, RN

## 2018-12-29 NOTE — Discharge Instructions (Signed)
°  Post Anesthesia Home Care Instructions  Activity: Get plenty of rest for the remainder of the day. A responsible individual must stay with you for 24 hours following the procedure.  For the next 24 hours, DO NOT: -Drive a car -Advertising copywriter -Drink alcoholic beverages -Take any medication unless instructed by your physician -Make any legal decisions or sign important papers.  Meals: Start with liquid foods such as gelatin or soup. Progress to regular foods as tolerated. Avoid greasy, spicy, heavy foods. If nausea and/or vomiting occur, drink only clear liquids until the nausea and/or vomiting subsides. Call your physician if vomiting continues.  Special Instructions/Symptoms: Your throat may feel dry or sore from the anesthesia or the breathing tube placed in your throat during surgery. If this causes discomfort, gargle with warm salt water. The discomfort should disappear within 24 hours.  If you had a scopolamine patch placed behind your ear for the management of post- operative nausea and/or vomiting:  1. The medication in the patch is effective for 72 hours, after which it should be removed.  Wrap patch in a tissue and discard in the trash. Wash hands thoroughly with soap and water. 2. You may remove the patch earlier than 72 hours if you experience unpleasant side effects which may include dry mouth, dizziness or visual disturbances. 3. Avoid touching the patch. Wash your hands with soap and water after contact with the patch.     YOU CAN COME OUT OF YOUR SLING AS COMFORT ALLOWS. NO HEAVY LIFTING WITH YOUR RIGHT ARM. DO NOT REACH OVERHEAD WITH YOUR TIGHT ARM. EXPECT SWELLING - ICE AS NEEDED. YOU CAN GET YOUR CURRENT DRESSING WET IN THE SHOWER DAILY. YOU CAN CHANGE YOUR CURRENT DRESSING IN ABOUT 5-6 DAYS AND PLACE A NEW DRESSING PROVIDED FOR YOU; LEAVE THAT DRESSING ON UNTIL YOUR OUTPATIENT FOLLOW-UP.

## 2018-12-29 NOTE — Op Note (Signed)
NAME: MCCADE, STUDLEY MEDICAL RECORD [id number] ACCOUNT [id number] DATE OF BIRTH:01-Jan-1976 FACILITY: [facility] LOCATION: [facility] PHYSICIAN:Keshawna Dix Aretha Parrot, MD  OPERATIVE REPORT  DATE OF PROCEDURE:  12/28/2018  PREOPERATIVE DIAGNOSIS:  Right midshaft comminuted clavicle fracture.  POSTOPERATIVE DIAGNOSIS:  Right midshaft comminuted clavicle fracture.  PROCEDURE:  Open reduction internal fixation of right midshaft comminuted clavicle fracture.  IMPLANTS:  Stryker 8-hole superior anatomic clavicle plate and screws.  SURGEON:  Vanita Panda. Magnus Ivan, MD  ASSISTANT:  Richardean Canal, PA-C.  ANESTHESIA:   1.  General. 2.  Local with 0.25% plain Marcaine.  ANTIBIOTICS:  Two grams IV Ancef.  ESTIMATED BLOOD LOSS:  100 mL.  COMPLICATIONS:  None.  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  He presented to an urgent care center and x-rays were obtained showing a midshaft  clavicle fracture.  There is shortening and comminution.  Due to the nature of this fracture, we recommended open reduction internal fixation.  I had a long and thorough discussion with the patient about the risks and benefits of the surgery including  risk of acute blood loss anemia, nerve or vessel injury, infection and nonunion.  He understands our goals are to decrease pain, improve mobility of the shoulder and improve quality of life.  DESCRIPTION OF PROCEDURE:  After informed consent was obtained and appropriate right shoulder was marked, he was brought to the operating room and placed supine on the operating table.  General anesthesia was then obtained.  His right shoulder and  clavicle area as well as axillary area and upper arm down past the elbow were prepped and draped with DuraPrep and sterile drapes including a sterile stockinette.  Time-out was called to identify correct patient,  correct right clavicle.  I then made a  long and wide incision over the clavicle because the patient has decent sized to him.  We were able to dissect down to the clavicle itself and found a highly comminuted fracture and at least 3 pieces.  It took some time teasing the fracture together  which was quite difficult and we were able to place an interfrag screw outside the plate to at least hold the lateral piece to one of the butterfly fragments.  I was then able to choose a 10-hole Stryker superior clavicle plate.  I chose a 10-hole plate  because of the need to get more medial to try to get some screw purchase.  It was certainly difficult getting the plate on to the bone close to where we need to have the reduction forceps to hold the fracture in reduced position.  Once we had it reduced,  we were able to place screws proximally and distally with a combination of locking and bicortical screws.  This brought the fracture down to an anatomic position.  I put the shoulder through range of motion and I was pleased with the reduction.  We  verified it under direct fluoroscopy as well.  We then irrigated the soft tissue with normal saline solution using bulb syringe and normal saline.  I then did place 10 mL of DBM putty with an allograft bone graft around the comminuted aspects of the  fracture.  We then closed the deep tissue over the plate with 0 Vicryl followed by 2-0 Vicryl subcutaneous tissue, 4-0 Monocryl subcuticular stitch and Steri-Strips on the skin.  An Aquacel dressing was applied.  Sling was applied on his arm as  well.  He  was awakened, extubated, and taken to recovery room in stable condition.  All final counts were correct.  There were no complications noted.  Of note, Rexene EdisonGil Clark, PA-C, assisted the entire case and his assistance was crucial for facilitating all aspects  of this case.  TN/NUANCE  D:12/28/2018 T:12/29/2018 JOB:[id number]

## 2019-01-01 ENCOUNTER — Encounter (HOSPITAL_COMMUNITY): Payer: Self-pay | Admitting: Orthopaedic Surgery

## 2019-01-04 ENCOUNTER — Encounter (HOSPITAL_COMMUNITY): Payer: Self-pay | Admitting: *Deleted

## 2019-01-04 ENCOUNTER — Encounter (INDEPENDENT_AMBULATORY_CARE_PROVIDER_SITE_OTHER): Payer: Self-pay | Admitting: Orthopaedic Surgery

## 2019-01-04 ENCOUNTER — Other Ambulatory Visit: Payer: Self-pay

## 2019-01-04 ENCOUNTER — Other Ambulatory Visit (INDEPENDENT_AMBULATORY_CARE_PROVIDER_SITE_OTHER): Payer: Self-pay | Admitting: Physician Assistant

## 2019-01-04 ENCOUNTER — Ambulatory Visit (INDEPENDENT_AMBULATORY_CARE_PROVIDER_SITE_OTHER): Payer: BLUE CROSS/BLUE SHIELD

## 2019-01-04 ENCOUNTER — Ambulatory Visit (INDEPENDENT_AMBULATORY_CARE_PROVIDER_SITE_OTHER): Payer: BLUE CROSS/BLUE SHIELD | Admitting: Orthopaedic Surgery

## 2019-01-04 DIAGNOSIS — S42021A Displaced fracture of shaft of right clavicle, initial encounter for closed fracture: Secondary | ICD-10-CM

## 2019-01-04 NOTE — Progress Notes (Signed)
Need orders in epic. Thank You. Surgery on 01/05/2019.

## 2019-01-04 NOTE — Progress Notes (Signed)
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  He is 1 week out from fixation of a comminuted midshaft clavicle fracture of his right clavicle.  He was doing well until yesterday when he sustained a mechanical fall when he was walking and slipped on wet leaves.  He did land on his shoulder awkwardly.  In the office today I see no deficits of the clavicle and I am looking in terms of inspection of the skin in his clavicle in general.  However 2 views of the right clavicle showed failure of fixation the fracture is displaced but not shortened.  I talked to him in length about this.  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  I talked about this in detail as well as the risk and benefits involved.  He is definitely in favor of proceeding to surgery tomorrow.  We would then see him back in 2 weeks postoperative with a repeat 2 views of his right clavicle.

## 2019-01-05 ENCOUNTER — Ambulatory Visit (HOSPITAL_COMMUNITY)
Admission: RE | Admit: 2019-01-05 | Discharge: 2019-01-05 | Disposition: A | Discharge status: Home / Self Care | Payer: BLUE CROSS/BLUE SHIELD | Attending: Orthopaedic Surgery | Admitting: Orthopaedic Surgery

## 2019-01-05 ENCOUNTER — Encounter (HOSPITAL_COMMUNITY)
Admission: RE | Disposition: A | Discharge status: Home / Self Care | Payer: Self-pay | Source: Home / Self Care | Attending: Orthopaedic Surgery

## 2019-01-05 ENCOUNTER — Ambulatory Visit (HOSPITAL_COMMUNITY): Payer: BLUE CROSS/BLUE SHIELD

## 2019-01-05 ENCOUNTER — Encounter (HOSPITAL_COMMUNITY): Payer: Self-pay | Admitting: Anesthesiology

## 2019-01-05 ENCOUNTER — Ambulatory Visit (HOSPITAL_COMMUNITY): Payer: BLUE CROSS/BLUE SHIELD | Admitting: Anesthesiology

## 2019-01-05 DIAGNOSIS — S42001A Fracture of unspecified part of right clavicle, initial encounter for closed fracture: Secondary | ICD-10-CM

## 2019-01-05 DIAGNOSIS — W01198A Fall on same level from slipping, tripping and stumbling with subsequent striking against other object, initial encounter: Secondary | ICD-10-CM | POA: Diagnosis not present

## 2019-01-05 DIAGNOSIS — Y9301 Activity, walking, marching and hiking: Secondary | ICD-10-CM | POA: Diagnosis not present

## 2019-01-05 DIAGNOSIS — K219 Gastro-esophageal reflux disease without esophagitis: Secondary | ICD-10-CM | POA: Diagnosis not present

## 2019-01-05 DIAGNOSIS — W010XXA Fall on same level from slipping, tripping and stumbling without subsequent striking against object, initial encounter: Secondary | ICD-10-CM | POA: Diagnosis not present

## 2019-01-05 DIAGNOSIS — S42021D Displaced fracture of shaft of right clavicle, subsequent encounter for fracture with routine healing: Secondary | ICD-10-CM | POA: Diagnosis not present

## 2019-01-05 DIAGNOSIS — Z79899 Other long term (current) drug therapy: Secondary | ICD-10-CM | POA: Insufficient documentation

## 2019-01-05 DIAGNOSIS — Y92007 Garden or yard of unspecified non-institutional (private) residence as the place of occurrence of the external cause: Secondary | ICD-10-CM | POA: Insufficient documentation

## 2019-01-05 DIAGNOSIS — G4733 Obstructive sleep apnea (adult) (pediatric): Secondary | ICD-10-CM | POA: Insufficient documentation

## 2019-01-05 DIAGNOSIS — S42021A Displaced fracture of shaft of right clavicle, initial encounter for closed fracture: Secondary | ICD-10-CM | POA: Insufficient documentation

## 2019-01-05 DIAGNOSIS — J301 Allergic rhinitis due to pollen: Secondary | ICD-10-CM | POA: Insufficient documentation

## 2019-01-05 DIAGNOSIS — F411 Generalized anxiety disorder: Secondary | ICD-10-CM | POA: Insufficient documentation

## 2019-01-05 DIAGNOSIS — Z419 Encounter for procedure for purposes other than remedying health state, unspecified: Secondary | ICD-10-CM

## 2019-01-05 HISTORY — PX: ORIF CLAVICULAR FRACTURE: SHX5055

## 2019-01-05 SURGERY — OPEN REDUCTION INTERNAL FIXATION (ORIF) CLAVICULAR FRACTURE
Anesthesia: General | Laterality: Right

## 2019-01-05 MED ORDER — FENTANYL CITRATE (PF) 250 MCG/5ML IJ SOLN
INTRAMUSCULAR | Status: AC
  Filled 2019-01-05: qty 5

## 2019-01-05 MED ORDER — LIDOCAINE 2% (20 MG/ML) 5 ML SYRINGE
INTRAMUSCULAR | Status: AC
  Filled 2019-01-05: qty 5

## 2019-01-05 MED ORDER — SUGAMMADEX SODIUM 500 MG/5ML IV SOLN
INTRAVENOUS | Status: DC | PRN
  Administered 2019-01-05: 250 mg via INTRAVENOUS

## 2019-01-05 MED ORDER — CHLORHEXIDINE GLUCONATE 4 % EX LIQD: 60.0000 mL | Freq: Once | CUTANEOUS | Status: DC

## 2019-01-05 MED ORDER — CEFAZOLIN SODIUM-DEXTROSE 2-4 GM/100ML-% IV SOLN
2.0000 g | INTRAVENOUS | Status: AC
  Administered 2019-01-05: 2 g via INTRAVENOUS
  Filled 2019-01-05: qty 100

## 2019-01-05 MED ORDER — HYDROCODONE-ACETAMINOPHEN 5-325 MG PO TABS: 1.0000 | ORAL_TABLET | Freq: Four times a day (QID) | ORAL | 0 refills | Status: DC | PRN

## 2019-01-05 MED ORDER — ONDANSETRON HCL 4 MG/2ML IJ SOLN
INTRAMUSCULAR | Status: AC
  Filled 2019-01-05: qty 2

## 2019-01-05 MED ORDER — PROPOFOL 10 MG/ML IV BOLUS
INTRAVENOUS | Status: DC | PRN
  Administered 2019-01-05: 170 mg via INTRAVENOUS

## 2019-01-05 MED ORDER — BUPIVACAINE HCL (PF) 0.25 % IJ SOLN
INTRAMUSCULAR | Status: AC
  Filled 2019-01-05: qty 30

## 2019-01-05 MED ORDER — FENTANYL CITRATE (PF) 100 MCG/2ML IJ SOLN
INTRAMUSCULAR | Status: DC | PRN
  Administered 2019-01-05: 100 ug via INTRAVENOUS
  Administered 2019-01-05: 25 ug via INTRAVENOUS
  Administered 2019-01-05: 50 ug via INTRAVENOUS
  Administered 2019-01-05: 100 ug via INTRAVENOUS
  Administered 2019-01-05: 25 ug via INTRAVENOUS

## 2019-01-05 MED ORDER — ONDANSETRON HCL 4 MG/2ML IJ SOLN
INTRAMUSCULAR | Status: DC | PRN
  Administered 2019-01-05: 4 mg via INTRAVENOUS

## 2019-01-05 MED ORDER — HYDROMORPHONE HCL 1 MG/ML IJ SOLN: 0.2500 mg | INTRAMUSCULAR | Status: DC | PRN

## 2019-01-05 MED ORDER — 0.9 % SODIUM CHLORIDE (POUR BTL) OPTIME
TOPICAL | Status: DC | PRN
  Administered 2019-01-05: 1000 mL

## 2019-01-05 MED ORDER — PROMETHAZINE HCL 25 MG/ML IJ SOLN: 6.2500 mg | INTRAMUSCULAR | Status: DC | PRN

## 2019-01-05 MED ORDER — FENTANYL CITRATE (PF) 100 MCG/2ML IJ SOLN
INTRAMUSCULAR | Status: AC
  Filled 2019-01-05: qty 2

## 2019-01-05 MED ORDER — LACTATED RINGERS IV SOLN: INTRAVENOUS | Status: DC

## 2019-01-05 MED ORDER — OXYCODONE HCL 5 MG PO TABS: 5.0000 mg | ORAL_TABLET | Freq: Once | ORAL | Status: DC | PRN

## 2019-01-05 MED ORDER — PROPOFOL 10 MG/ML IV BOLUS
INTRAVENOUS | Status: AC
  Filled 2019-01-05: qty 20

## 2019-01-05 MED ORDER — MIDAZOLAM HCL 5 MG/5ML IJ SOLN
INTRAMUSCULAR | Status: DC | PRN
  Administered 2019-01-05: 2 mg via INTRAVENOUS

## 2019-01-05 MED ORDER — OXYCODONE HCL 5 MG/5ML PO SOLN
5.0000 mg | Freq: Once | ORAL | Status: DC | PRN
  Filled 2019-01-05: qty 5

## 2019-01-05 MED ORDER — ROCURONIUM BROMIDE 10 MG/ML (PF) SYRINGE
PREFILLED_SYRINGE | INTRAVENOUS | Status: AC
  Filled 2019-01-05: qty 10

## 2019-01-05 MED ORDER — MEPERIDINE HCL 50 MG/ML IJ SOLN: 6.2500 mg | INTRAMUSCULAR | Status: DC | PRN

## 2019-01-05 MED ORDER — DEXAMETHASONE SODIUM PHOSPHATE 10 MG/ML IJ SOLN
INTRAMUSCULAR | Status: AC
  Filled 2019-01-05: qty 1

## 2019-01-05 MED ORDER — OXYCODONE HCL 5 MG PO TABS
ORAL_TABLET | ORAL | Status: AC
  Filled 2019-01-05: qty 1

## 2019-01-05 MED ORDER — SUGAMMADEX SODIUM 500 MG/5ML IV SOLN
INTRAVENOUS | Status: AC
  Filled 2019-01-05: qty 5

## 2019-01-05 MED ORDER — ACETAMINOPHEN 10 MG/ML IV SOLN
INTRAVENOUS | Status: DC | PRN
  Administered 2019-01-05: 1000 mg via INTRAVENOUS

## 2019-01-05 MED ORDER — ACETAMINOPHEN 10 MG/ML IV SOLN
INTRAVENOUS | Status: AC
  Filled 2019-01-05: qty 100

## 2019-01-05 MED ORDER — BUPIVACAINE HCL (PF) 0.25 % IJ SOLN
INTRAMUSCULAR | Status: DC | PRN
  Administered 2019-01-05: 30 mL

## 2019-01-05 MED ORDER — DEXAMETHASONE SODIUM PHOSPHATE 10 MG/ML IJ SOLN
INTRAMUSCULAR | Status: DC | PRN
  Administered 2019-01-05: 10 mg via INTRAVENOUS

## 2019-01-05 MED ORDER — MIDAZOLAM HCL 2 MG/2ML IJ SOLN
INTRAMUSCULAR | Status: AC
  Filled 2019-01-05: qty 2

## 2019-01-05 MED ORDER — ROCURONIUM BROMIDE 100 MG/10ML IV SOLN
INTRAVENOUS | Status: DC | PRN
  Administered 2019-01-05: 10 mg via INTRAVENOUS
  Administered 2019-01-05: 60 mg via INTRAVENOUS
  Administered 2019-01-05: 20 mg via INTRAVENOUS

## 2019-01-05 MED ORDER — LACTATED RINGERS IV SOLN
INTRAVENOUS | Status: DC
  Administered 2019-01-05 (×2): via INTRAVENOUS

## 2019-01-05 SURGICAL SUPPLY — 57 items
BAG ZIPLOCK 12X15 (MISCELLANEOUS) ×1 IMPLANT
BIT DRILL 2.0 (BIT) ×1 IMPLANT
COVER SURGICAL LIGHT HANDLE (MISCELLANEOUS) ×2 IMPLANT
COVER WAND RF STERILE (DRAPES) ×1 IMPLANT
DRAPE C-ARM 42X120 X-RAY (DRAPES) ×2 IMPLANT
DRAPE INCISE IOBAN 66X45 STRL (DRAPES) IMPLANT
DRAPE ORTHO SPLIT 77X108 STRL (DRAPES)
DRAPE SURG 17X11 SM STRL (DRAPES) ×2 IMPLANT
DRAPE SURG ORHT 6 SPLT 77X108 (DRAPES) IMPLANT
DRAPE U-SHAPE 47X51 STRL (DRAPES) ×2 IMPLANT
DRESSING AQUACEL AG SP 3.5X10 (GAUZE/BANDAGES/DRESSINGS) IMPLANT
DRILL 2.6X220MM LONG AO (BIT) ×1 IMPLANT
DRSG AQUACEL AG SP 3.5X10 (GAUZE/BANDAGES/DRESSINGS) ×2
DRSG EMULSION OIL 3X3 NADH (GAUZE/BANDAGES/DRESSINGS) ×2 IMPLANT
DRSG PAD ABDOMINAL 8X10 ST (GAUZE/BANDAGES/DRESSINGS) IMPLANT
DURAPREP 26ML APPLICATOR (WOUND CARE) ×2 IMPLANT
ELECT REM PT RETURN 15FT ADLT (MISCELLANEOUS) ×2 IMPLANT
GAUZE SPONGE 4X4 12PLY STRL (GAUZE/BANDAGES/DRESSINGS) ×2 IMPLANT
GAUZE XEROFORM 1X8 LF (GAUZE/BANDAGES/DRESSINGS) ×1 IMPLANT
GLOVE BIO SURGEON STRL SZ7.5 (GLOVE) ×2 IMPLANT
GLOVE BIOGEL PI IND STRL 8 (GLOVE) ×2 IMPLANT
GLOVE BIOGEL PI INDICATOR 8 (GLOVE) ×2
GLOVE ECLIPSE 8.0 STRL XLNG CF (GLOVE) ×2 IMPLANT
GLOVE ORTHO TXT STRL SZ7.5 (GLOVE) ×2 IMPLANT
GOWN STRL REUS W/TWL LRG LVL3 (GOWN DISPOSABLE) ×2 IMPLANT
GOWN STRL REUS W/TWL XL LVL3 (GOWN DISPOSABLE) ×2 IMPLANT
KIT BASIN OR (CUSTOM PROCEDURE TRAY) ×2 IMPLANT
MANIFOLD NEPTUNE II (INSTRUMENTS) ×2 IMPLANT
NDL HYPO 25X1 1.5 SAFETY (NEEDLE) IMPLANT
NEEDLE HYPO 25X1 1.5 SAFETY (NEEDLE) IMPLANT
NS IRRIG 1000ML POUR BTL (IV SOLUTION) ×2 IMPLANT
PACK SHOULDER (CUSTOM PROCEDURE TRAY) ×2 IMPLANT
PASSER SUT SWANSON 36MM LOOP (INSTRUMENTS) ×1 IMPLANT
PROTECTOR NERVE ULNAR (MISCELLANEOUS) ×2 IMPLANT
SCREW BONE 2.7X16MM (Screw) ×1 IMPLANT
SCREW BONE 3.5X18MM (Screw) ×1 IMPLANT
SCREW BONE 3.5X20MM (Screw) ×1 IMPLANT
SCREW LOCKING 16MM (Screw) ×1 IMPLANT
SCREW LOCKING 18MMX3.5MM (Screw) ×1 IMPLANT
SCREW LOCKING 3.5X22 (Screw) ×2 IMPLANT
SLING ARM IMMOBILIZER LRG (SOFTGOODS) ×3 IMPLANT
SPONGE LAP 18X18 RF (DISPOSABLE) ×1 IMPLANT
STRIP CLOSURE SKIN 1/2X4 (GAUZE/BANDAGES/DRESSINGS) ×2 IMPLANT
SUCTION FRAZIER HANDLE 10FR (MISCELLANEOUS) ×1
SUCTION TUBE FRAZIER 10FR DISP (MISCELLANEOUS) ×1 IMPLANT
SUT ETHILON 2 0 PS N (SUTURE) ×3 IMPLANT
SUT ETHILON 3 0 PS 1 (SUTURE) IMPLANT
SUT FIBERWIRE #2 38 T-5 BLUE (SUTURE) ×4
SUT MNCRL AB 4-0 PS2 18 (SUTURE) ×2 IMPLANT
SUT PROLENE 3 0 PS 1 (SUTURE) IMPLANT
SUT VIC AB 0 CT1 36 (SUTURE) ×2 IMPLANT
SUT VIC AB 2-0 CT1 27 (SUTURE) ×1
SUT VIC AB 2-0 CT1 TAPERPNT 27 (SUTURE) ×1 IMPLANT
SUTURE FIBERWR #2 38 T-5 BLUE (SUTURE) IMPLANT
SYR CONTROL 10ML LL (SYRINGE) IMPLANT
TOWEL OR 17X26 10 PK STRL BLUE (TOWEL DISPOSABLE) ×3 IMPLANT
WATER STERILE IRR 1000ML POUR (IV SOLUTION) ×2 IMPLANT

## 2019-01-05 NOTE — Op Note (Signed)
NAME: Gaylan GeroldGERMEROTH, Codylee R. MEDICAL RECORD [id number] ACCOUNT [id number] DATE OF BIRTH:07-21-1976 FACILITY: [facility] LOCATION: [facility] PHYSICIAN:Cleota Pellerito Aretha ParrotY. Kymberlyn Eckford, MD  OPERATIVE REPORT  DATE OF PROCEDURE:  01/05/2019  PREOPERATIVE DIAGNOSIS:  Right clavicle fracture fixation failure status post open reduction internal fixation, status post mechanical fall.  POSTOPERATIVE DIAGNOSIS:  Right clavicle fracture fixation failure status post open reduction internal fixation, status post mechanical fall.  PROCEDURE:  Revision open reduction internal fixation of right clavicle fracture.  SURGEON:  Vanita PandaChristopher Y. Magnus IvanBlackman, MD  ASSISTANT:  Richardean CanalGilbert Clark, PA-C.  ANESTHESIA: 1.  General endotracheal. 2.  Local with 0.25% plain Marcaine.  ANTIBIOTICS:  Two grams IV Ancef.  ESTIMATED BLOOD LOSS:  Less than 100 mL  COMPLICATIONS:  None.  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  Two days later, which was 6 days ago, we took him to the operating room  that evening and performed open reduction and internal fixation of the clavicle fracture.  I felt pretty good about my fracture fixation, however, in hindsight certainly we may needed to even put more fixation at that time.  It did feel secure, but then  2 days ago he sustained a hard mechanical fall walking in his yard when he slipped on wet leaves.  I saw him in the office yesterday and found that he had lost his fracture reduction.  At that point I recommended he undergo revision open reduction  internal fixation of the right midshaft clavicle fracture.  I explained to him in detail about this as well as the risks involved and he absolutely would need to proceed with the surgery and he did understand this as well.  DESCRIPTION OF PROCEDURE:  After informed consent was obtained and appropriate right  shoulder was marked, he was brought to the operating room and placed supine on the operating table.  General anesthesia was then obtained.  We prepped widely around his  shoulder with DuraPrep and sterile drapes and a sterile stockinette.  We were able to position his head off to the side just a little bit in order to give us better angle for the plate.  Of note, the previous plate we had in his shoulder ____ longest  clavicle plate they have.  We then opened up his previous incision and actually extended it a little bit more laterally and medially.  Once we dissected down to the fracture, we felt that it was definitely apart.  The medial screws were all loose and I  removed all of them.  I removed one lateral screw.  After we were able to do this, we were able to better position the plate on the clavicle and the medial aspect pushing it more anterior.  Once we were able to push more anterior, we clamped it down and  secured it with a bicortical locking screw.  This then anatomically reduced the fracture.  We still kept reduction forceps on this.  We then placed bicortical screws proximally and distally with locking and nonlocking screws.  We were able to then place  a second interfragmentary screw across the fracture and then I used two 2-0 FiberWire sutures as a cerclage around the central aspect of the fracture as well and had it anatomically reduced.  We actually put a lot of stress on his shoulder to see if we  could get this to come apart and it would not come apart.  I  felt good about the fixation.  We verified the placement, plate and fixation of the fracture under direct fluoroscopy as well.  We then closed the deep tissue over the plate with 0 Vicryl  followed by 2-0 Vicryl subcutaneous tissue and interrupted 2-0 nylon in the skin.  We infiltrated the incision with 0.25% plain Marcaine.  Xeroform well-padded sterile dressing was applied.  The shoulder was placed back in a sling.  He was awakened,   extubated, and taken to recovery room in stable condition.  All final counts were correct.  There were no complications noted.  Of note, Rexene Edison, PA-C, assisted the entire case.  His assistance was crucial for facilitating all aspects of this case.  TN/NUANCE  D:01/05/2019 T:01/05/2019 JOB:[id number]

## 2019-01-05 NOTE — H&P (Signed)
Daniel Cardenas is an 43 y.o. male.   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  He was taken to the OR a week ago and underwent ORIF of his right clavicle.  He unfortunately sustained a hard mechanical fall the day before yesterday and re-injured his right clavicle.  X-rays show loss of fixation and revision surgery has been recommended.  Past Medical History:  Diagnosis Date  . Elevated alkaline phosphatase level    Abd u/s normal 04/01/17  . External hemorrhoid, bleeding   . GAD (generalized anxiety disorder)    pt will be on citalopram indefinitely  . GERD (gastroesophageal reflux disease)    daily prilosec OTC helps  . Hay fever   . History of depression   . OSA on CPAP 08/2014   Followed by Dr. Shelle Iron    Past Surgical History:  Procedure Laterality Date  . ORIF CLAVICULAR FRACTURE Right 12/28/2018   Procedure: OPEN REDUCTION INTERNAL FIXATION (ORIF) CLAVICULAR FRACTURE;  Surgeon: Kathryne Hitch, MD;  Location: [facility] OR;  Service: Orthopedics;  Laterality: Right;  . WISDOM TOOTH EXTRACTION  1998    Family History  Problem Relation Age of Onset  . Arthritis Mother   . Arthritis Father   . Hyperlipidemia Father   . Hypertension Father   . Mental illness Father        Frontotemporal degeneration  . Diabetes Father   . Stroke Maternal Grandfather   . Hypertension Paternal Grandfather   . Mental illness Paternal Grandfather        Multi-infarct dementia  . Diabetes Paternal Grandfather    Social History:  reports that he has never smoked. He has never used smokeless tobacco. He reports current alcohol use. He reports that he does not use drugs.  Allergies: No Known Allergies  Medications Prior to Admission  Medication Sig Dispense Refill  . cetirizine (ZYRTEC) 10 MG tablet Take 10 mg by mouth daily.    . citalopram (CELEXA) 40 MG tablet Take 1 tablet (40 mg  total) by mouth daily. (Patient taking differently: Take 20 mg by mouth daily. ) 90 tablet 3  . omeprazole (PRILOSEC) 20 MG capsule Take 1 capsule (20 mg total) by mouth daily. OFFICE VISIT NEEDED (Patient taking differently: Take 20 mg by mouth daily. ) 90 capsule 0  . HYDROcodone-acetaminophen (NORCO/VICODIN) 5-325 MG tablet Take 1-2 tablets by mouth every 6 (six) hours as needed for moderate pain. 40 tablet 0    No results found for this or any previous visit (from the past 48 hour(s)). Xr Clavicle Right  Result Date: 01/04/2019 2 views of the right clavicle show displacement of a previously fixed clavicle fracture with failure of fixation.   Review of Systems  All other systems reviewed and are negative.   Blood pressure (!) 144/89, pulse 84, temperature 98.6 F (37 C), temperature source Oral, resp. rate 18, height 5\' 9"  (1.753 m), weight 112.1 kg, SpO2 100 %. Physical Exam  Constitutional: He is oriented to person, place, and time. He appears well-developed.  HENT:  Head: Normocephalic and atraumatic.  Eyes: Pupils are equal, round, and reactive to light.  Neck: Normal range of motion.  Cardiovascular: Normal rate.  Respiratory: Effort normal.  GI: Soft.  Musculoskeletal:     Right shoulder: He exhibits bony tenderness, swelling and deformity.  Neurological: He is alert and oriented to person, place, and time.  Skin: Skin is warm and dry.  Psychiatric: He has a normal mood and affect.     Assessment/Plan Right clavicle fracture with failed fixation status-post ORIF  To the OR today for revision fixation of his right clavicle.  Risks and benefits have been discussed in detail and informed consent is obtained.  Kathryne Hitchhristopher Y Kenneith Stief, MD 01/05/2019, 12:05 PM

## 2019-01-05 NOTE — Anesthesia Preprocedure Evaluation (Addendum)
Anesthesia Evaluation  Patient identified by MRN, date of birth, ID band Patient awake    Reviewed: Allergy & Precautions, NPO status , Patient's Chart, lab work & pertinent test results  Airway Mallampati: I  TM Distance: >3 FB Neck ROM: Full    Dental  (+) Teeth Intact, Dental Advisory Given   Pulmonary sleep apnea and Continuous Positive Airway Pressure Ventilation ,    breath sounds clear to auscultation       Cardiovascular negative cardio ROS   Rhythm:Regular Rate:Normal     Neuro/Psych    GI/Hepatic Neg liver ROS, GERD  Medicated,  Endo/Other  negative endocrine ROS  Renal/GU negative Renal ROS     Musculoskeletal negative musculoskeletal ROS (+)   Abdominal   Peds  Hematology negative hematology ROS (+)   Anesthesia Other Findings   Reproductive/Obstetrics                            Anesthesia Physical Anesthesia Plan  ASA: II  Anesthesia Plan: General   Post-op Pain Management:    Induction: Intravenous  PONV Risk Score and Plan: 3 and Ondansetron and Dexamethasone  Airway Management Planned: Oral ETT  Additional Equipment: None  Intra-op Plan:   Post-operative Plan: Extubation in OR  Informed Consent: I have reviewed the patients History and Physical, chart, labs and discussed the procedure including the risks, benefits and alternatives for the proposed anesthesia with the patient or authorized representative who has indicated his/her understanding and acceptance.   Dental advisory given  Plan Discussed with: CRNA  Anesthesia Plan Comments:        Anesthesia Quick Evaluation

## 2019-01-05 NOTE — Discharge Instructions (Signed)
Ice as needed for right shoulder swelling. You can get your dressing wet in the shower daily. You can change your dressing every 4-5 days if needed. Come out of your sling to shower and to occasionally move your shoulder, elbow, wrist and hand.

## 2019-01-05 NOTE — Brief Op Note (Signed)
01/05/2019  2:50 PM  PATIENT:  Daniel Cardenas  43 y.o. male  PRE-OPERATIVE DIAGNOSIS:  right clavicle fracture  POST-OPERATIVE DIAGNOSIS:  right clavicle fracture  PROCEDURE:  Procedure(s): OPEN REDUCTION INTERNAL FIXATION (ORIF)  RIGHT CLAVICLE FRACTURE (Right) Revision Fixation of right clavicle  SURGEON:  Surgeon(s) and Role:    Kathryne Hitch, MD - Primary  PHYSICIAN ASSISTANT: Rexene Edison, PA-C  ANESTHESIA:   local and general  EBL:  50 mL   COUNTS:  YES  TOURNIQUET:  * No tourniquets in log *  DICTATION: .Other Dictation: Dictation Number [id number]  PLAN OF CARE: Admit to inpatient   PATIENT DISPOSITION:  PACU - hemodynamically stable.   Delay start of Pharmacological VTE agent (>24hrs) due to surgical blood loss or risk of bleeding: no

## 2019-01-05 NOTE — Anesthesia Procedure Notes (Signed)
Procedure Name: Intubation Date/Time: 01/05/2019 1:13 PM Performed by: Thornell Mule, CRNA Pre-anesthesia Checklist: Patient identified, Emergency Drugs available, Suction available and Patient being monitored Patient Re-evaluated:Patient Re-evaluated prior to induction Oxygen Delivery Method: Circle system utilized Preoxygenation: Pre-oxygenation with 100% oxygen Induction Type: IV induction Ventilation: Mask ventilation without difficulty Laryngoscope Size: Miller and 3 Grade View: Grade I Tube type: Oral Tube size: 7.5 mm Number of attempts: 1 Airway Equipment and Method: Stylet and Oral airway Placement Confirmation: ETT inserted through vocal cords under direct vision,  positive ETCO2 and breath sounds checked- equal and bilateral Secured at: 22 cm Tube secured with: Tape Dental Injury: Teeth and Oropharynx as per pre-operative assessment

## 2019-01-05 NOTE — Transfer of Care (Signed)
Immediate Anesthesia Transfer of Care Note  Patient: Daniel Cardenas  Procedure(s) Performed: OPEN REDUCTION INTERNAL FIXATION (ORIF)  RIGHT CLAVICLE FRACTURE (Right )  Patient Location: PACU  Anesthesia Type:General  Level of Consciousness: awake, alert  and oriented  Airway & Oxygen Therapy: Patient Spontanous Breathing and Patient connected to face mask oxygen  Post-op Assessment: Report given to RN and Post -op Vital signs reviewed and stable  Post vital signs: Reviewed and stable  Last Vitals:  Vitals Value Taken Time  BP 160/89 01/05/2019  3:19 PM  Temp    Pulse 96 01/05/2019  3:24 PM  Resp 20 01/05/2019  3:24 PM  SpO2 100 % 01/05/2019  3:24 PM  Vitals shown include unvalidated device data.  Last Pain:  Vitals:   01/05/19 1027  TempSrc: Oral         Complications: No apparent anesthesia complications

## 2019-01-07 NOTE — Anesthesia Postprocedure Evaluation (Signed)
Anesthesia Post Note  Patient: Daniel GeroldJohn R Cardenas  Procedure(s) Performed: OPEN REDUCTION INTERNAL FIXATION (ORIF)  RIGHT CLAVICLE FRACTURE (Right )     Patient location during evaluation: PACU Anesthesia Type: General Level of consciousness: awake and alert Pain management: pain level controlled Vital Signs Assessment: post-procedure vital signs reviewed and stable Respiratory status: spontaneous breathing, nonlabored ventilation, respiratory function stable and patient connected to nasal cannula oxygen Cardiovascular status: blood pressure returned to baseline and stable Postop Assessment: no apparent nausea or vomiting Anesthetic complications: no    Last Vitals:  Vitals:   01/05/19 1545 01/05/19 1600  BP: (!) 148/102 (!) 168/99  Pulse: 88 92  Resp: (!) 22 16  Temp:  37.1 C  SpO2: 100% 96%    Last Pain:  Vitals:   01/05/19 1600  TempSrc:   PainSc: 3                  Trevor IhaStephen A Houser

## 2019-01-08 ENCOUNTER — Encounter: Payer: Self-pay | Admitting: Family Medicine

## 2019-01-11 ENCOUNTER — Encounter (HOSPITAL_COMMUNITY): Payer: Self-pay | Admitting: Orthopaedic Surgery

## 2019-01-15 ENCOUNTER — Inpatient Hospital Stay (INDEPENDENT_AMBULATORY_CARE_PROVIDER_SITE_OTHER): Payer: BLUE CROSS/BLUE SHIELD | Admitting: Orthopaedic Surgery

## 2019-01-18 ENCOUNTER — Ambulatory Visit (INDEPENDENT_AMBULATORY_CARE_PROVIDER_SITE_OTHER): Payer: BLUE CROSS/BLUE SHIELD | Admitting: Orthopaedic Surgery

## 2019-01-18 ENCOUNTER — Ambulatory Visit (INDEPENDENT_AMBULATORY_CARE_PROVIDER_SITE_OTHER): Payer: BLUE CROSS/BLUE SHIELD

## 2019-01-18 ENCOUNTER — Encounter (INDEPENDENT_AMBULATORY_CARE_PROVIDER_SITE_OTHER): Payer: Self-pay | Admitting: Orthopaedic Surgery

## 2019-01-18 DIAGNOSIS — Z9889 Other specified postprocedural states: Secondary | ICD-10-CM | POA: Diagnosis not present

## 2019-01-18 DIAGNOSIS — S42001D Fracture of unspecified part of right clavicle, subsequent encounter for fracture with routine healing: Secondary | ICD-10-CM

## 2019-01-18 DIAGNOSIS — Z8781 Personal history of (healed) traumatic fracture: Secondary | ICD-10-CM

## 2019-01-18 NOTE — Progress Notes (Signed)
Daniel Cardenas is here 2 weeks tomorrow status post revision fixation of a right comminuted midshaft clavicle fracture.  His first plate and screws failed and pulled off the bone after mechanical fall.  We taken back to the operating room and were able to revise the plate and put extra fixation in the shoulder.  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  He says it only hurt 1 time when he rolled over on his right shoulder in bed but otherwise has been doing fine.  On examination his incision looks good.  I remove the sutures in place Steri-Strips.  Clinically his shoulder appears to be in a normal position as does the clavicle.  X-rays of his clavicle in 2 views shows that the plate and screws are intact and the clavicle is in a good resting position and out to length.  Fracture plane is still visible.  He will continue to go slow in terms of his activities but overall looks good.  We will see him back in 4 weeks for repeat 2 views of his right clavicle.

## 2019-02-14 ENCOUNTER — Ambulatory Visit (INDEPENDENT_AMBULATORY_CARE_PROVIDER_SITE_OTHER): Payer: BLUE CROSS/BLUE SHIELD | Admitting: Orthopaedic Surgery

## 2019-02-14 ENCOUNTER — Ambulatory Visit (INDEPENDENT_AMBULATORY_CARE_PROVIDER_SITE_OTHER): Payer: BLUE CROSS/BLUE SHIELD

## 2019-02-14 ENCOUNTER — Encounter (INDEPENDENT_AMBULATORY_CARE_PROVIDER_SITE_OTHER): Payer: Self-pay | Admitting: Orthopaedic Surgery

## 2019-02-14 DIAGNOSIS — S42001D Fracture of unspecified part of right clavicle, subsequent encounter for fracture with routine healing: Secondary | ICD-10-CM | POA: Diagnosis not present

## 2019-02-14 NOTE — Progress Notes (Signed)
Daniel Cardenas is now about 6 weeks since having 2 surgeries on his right clavicle.  He is doing well overall.  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  Range of motion of his right shoulder and clavicle are normal and full.  He has some slight numbness around the incision site itself but otherwise no issues.  2 views of the right clavicle show no evidence of hardware failure.  I am pleased with the alignment overall.  There is been slight interval healing.  We will still avoid any type of high impact aerobic activity with that shoulder.  We will see him back in 6 weeks with a repeat 2 views of the right clavicle.  All question concerns were answered and addressed.

## 2019-03-26 ENCOUNTER — Telehealth (INDEPENDENT_AMBULATORY_CARE_PROVIDER_SITE_OTHER): Payer: Self-pay | Admitting: *Deleted

## 2019-03-26 NOTE — Telephone Encounter (Signed)
I called pt to prescreen for COVID 19 before appt scheduled on 03/27/19, [facility] to go over the screening questions.

## 2019-03-27 ENCOUNTER — Ambulatory Visit (INDEPENDENT_AMBULATORY_CARE_PROVIDER_SITE_OTHER): Payer: BLUE CROSS/BLUE SHIELD | Admitting: Orthopaedic Surgery

## 2019-08-15 ENCOUNTER — Other Ambulatory Visit (HOSPITAL_COMMUNITY)
Admission: RE | Admit: 2019-08-15 | Discharge: 2019-08-15 | Disposition: A | Discharge status: Home / Self Care | Payer: BC Managed Care – PPO | Source: Ambulatory Visit | Attending: Anesthesiology | Admitting: Anesthesiology

## 2019-08-15 ENCOUNTER — Other Ambulatory Visit: Payer: Self-pay

## 2019-08-15 DIAGNOSIS — Z01812 Encounter for preprocedural laboratory examination: Secondary | ICD-10-CM | POA: Diagnosis not present

## 2019-08-15 DIAGNOSIS — Z20828 Contact with and (suspected) exposure to other viral communicable diseases: Secondary | ICD-10-CM | POA: Insufficient documentation

## 2019-08-15 LAB — SARS CORONAVIRUS 2 BY RT PCR (HOSPITAL ORDER, PERFORMED IN ~~LOC~~ HOSPITAL LAB): SARS Coronavirus 2: NEGATIVE

## 2019-12-10 ENCOUNTER — Other Ambulatory Visit: Payer: Self-pay

## 2019-12-10 ENCOUNTER — Ambulatory Visit (INDEPENDENT_AMBULATORY_CARE_PROVIDER_SITE_OTHER): Payer: BC Managed Care – PPO | Admitting: Family Medicine

## 2019-12-10 DIAGNOSIS — S39012A Strain of muscle, fascia and tendon of lower back, initial encounter: Secondary | ICD-10-CM | POA: Diagnosis not present

## 2019-12-10 DIAGNOSIS — M545 Low back pain, unspecified: Secondary | ICD-10-CM

## 2019-12-10 MED ORDER — PREDNISONE 20 MG PO TABS: ORAL_TABLET | ORAL | 0 refills | Status: DC

## 2019-12-10 MED ORDER — CYCLOBENZAPRINE HCL 10 MG PO TABS: 10.0000 mg | ORAL_TABLET | Freq: Three times a day (TID) | ORAL | 0 refills | Status: DC | PRN

## 2019-12-10 NOTE — Progress Notes (Signed)
Virtual Visit via Video Note  I connected with pt on 12/10/19 at  4:00 PM EST by a video enabled telemedicine application and verified that I am speaking with the correct person using two identifiers.  Location patient: home Location provider:work or home office Persons participating in the virtual visit: patient, provider  I discussed the limitations of evaluation and management by telemedicine and the availability of in person appointments. The patient expressed understanding and agreed to proceed.  Telemedicine visit is a necessity given the COVID-19 restrictions in place at the current time.  HPI: 43 y/o WM being seen today for back pain. Onset 3 d/a, bent over to pick up a nail, felt severe LB pain, diffuse, w/out rad/paresthesias, or LE weakness. No saddle anesthesia or loss of b/b control. Has some vague visceral-type discomfort in pelvic/GU region.  Feels severe spasms, has him bent over and stiff. Ice+, magnesium in high doses has helped but caused diarrhea. Hx of similar LB strain in remote past that responded well to prednisone.  ROS: See pertinent positives and negatives per HPI.  Past Medical History:  Diagnosis Date  . Elevated alkaline phosphatase level    Abd u/s normal 04/01/17  . External hemorrhoid, bleeding   . GAD (generalized anxiety disorder)    pt will be on citalopram indefinitely  . GERD (gastroesophageal reflux disease)    daily prilosec OTC helps  . H/O clavicle fracture 12/29/2018   ORIF 12/29/2018, revision 01/05/2019 b/c patient fell on clavicle AGAIN.  Marland Kitchen Hay fever   . History of depression   . OSA on CPAP 08/2014   Followed by Dr. Gwenette Greet    Past Surgical History:  Procedure Laterality Date  . ORIF CLAVICULAR FRACTURE Right 12/28/2018   with revision 01/05/2019.  Procedure: OPEN REDUCTION INTERNAL FIXATION (ORIF) CLAVICULAR FRACTURE;  Surgeon: Mcarthur Rossetti, MD;  Location: [place];  Service: Orthopedics;  Laterality: Right;  . ORIF CLAVICULAR  FRACTURE Right 01/05/2019   Procedure: OPEN REDUCTION INTERNAL FIXATION (ORIF)  RIGHT CLAVICLE FRACTURE;  Surgeon: Mcarthur Rossetti, MD;  Location: [facility];  Service: Orthopedics;  Laterality: Right;  . WISDOM TOOTH EXTRACTION  1998    Family History  Problem Relation Age of Onset  . Arthritis Mother   . Arthritis Father   . Hyperlipidemia Father   . Hypertension Father   . Mental illness Father        Frontotemporal degeneration  . Diabetes Father   . Stroke Maternal Grandfather   . Hypertension Paternal Grandfather   . Mental illness Paternal Grandfather        Multi-infarct dementia  . Diabetes Paternal Grandfather      Current Outpatient Medications:  .  cetirizine (ZYRTEC) 10 MG tablet, Take 10 mg by mouth daily., Disp: , Rfl:  .  esomeprazole (NEXIUM) 20 MG capsule, Take 20 mg by mouth daily., Disp: , Rfl:   EXAM:  VITALS per patient if applicable: There were no vitals taken for this visit.   GENERAL: alert, oriented, appears well and in no acute distress  HEENT: atraumatic, conjunttiva clear, no obvious abnormalities on inspection of external nose and ears  NECK: normal movements of the head and neck  LUNGS: on inspection no signs of respiratory distress, breathing rate appears normal, no obvious gross SOB, gasping or wheezing  CV: no obvious cyanosis  MS: moves all visible extremities without noticeable abnormality  PSYCH/NEURO: pleasant and cooperative, no obvious depression or anxiety, speech and thought processing grossly intact  LABS: none today  Chemistry      Component Value Date/Time   NA 139 03/17/2017 1608   K 4.0 03/17/2017 1608   CL 104 03/17/2017 1608   CO2 27 03/17/2017 1608   BUN 11 03/17/2017 1608   CREATININE 0.86 03/17/2017 1608      Component Value Date/Time   CALCIUM 9.5 03/17/2017 1608   ALKPHOS 99 03/17/2017 1608   AST 23 03/17/2017 1608   ALT 23 03/17/2017 1608   BILITOT 1.7 (H) 03/17/2017 1608     Lab Results   Component Value Date   WBC 8.6 03/17/2017   HGB 15.7 03/17/2017   HCT 44.0 03/17/2017   MCV 90.7 03/17/2017   PLT 233.0 03/17/2017   Lab Results  Component Value Date   TSH 1.33 03/17/2017    ASSESSMENT AND PLAN:  Discussed the following assessment and plan:  Acute low back strain, with significant muscle spasms leading to signif impairment of functioning. Prednisone has worked very well in remote past for similar injury. Will rx prednisone 40mg  qd x 5d. Also flexeril 10mg  q8h prn. Ice, rest, stretching. Signs/symptoms to call or return for were reviewed and pt expressed understanding.    -we discussed possible serious and likely etiologies, options for evaluation and workup, limitations of telemedicine visit vs in person visit, treatment, treatment risks and precautions. Pt prefers to treat via telemedicine empirically rather then risking or undertaking an in person visit at this moment. Patient agrees to seek prompt in person care if worsening, new symptoms arise, or if is not improving with treatment.   I discussed the assessment and treatment plan with the patient. The patient was provided an opportunity to ask questions and all were answered. The patient agreed with the plan and demonstrated an understanding of the instructions.   The patient was advised to call back or seek an in-person evaluation if the symptoms worsen or if the condition fails to improve as anticipated.  F/u: prn  Signed:  , MD           12/10/2019

## 2019-12-28 DIAGNOSIS — U071 COVID-19: Secondary | ICD-10-CM

## 2019-12-28 HISTORY — DX: COVID-19: U07.1

## 2019-12-30 DIAGNOSIS — U071 COVID-19: Secondary | ICD-10-CM | POA: Diagnosis not present

## 2019-12-30 DIAGNOSIS — Z01812 Encounter for preprocedural laboratory examination: Secondary | ICD-10-CM | POA: Diagnosis not present

## 2019-12-30 LAB — SARS CORONAVIRUS 2 (TAT 6-24 HRS): SARS Coronavirus 2: POSITIVE — AB

## 2019-12-31 ENCOUNTER — Other Ambulatory Visit (HOSPITAL_COMMUNITY)
Admission: RE | Admit: 2019-12-31 | Discharge: 2019-12-31 | Disposition: A | Discharge status: Home / Self Care | Payer: BC Managed Care – PPO | Source: Ambulatory Visit | Attending: General Surgery | Admitting: General Surgery

## 2019-12-31 ENCOUNTER — Other Ambulatory Visit (HOSPITAL_COMMUNITY): Payer: BC Managed Care – PPO

## 2019-12-31 DIAGNOSIS — U071 COVID-19: Secondary | ICD-10-CM | POA: Insufficient documentation

## 2019-12-31 DIAGNOSIS — Z01812 Encounter for preprocedural laboratory examination: Secondary | ICD-10-CM | POA: Insufficient documentation

## 2020-02-15 ENCOUNTER — Encounter: Payer: Self-pay | Admitting: Family Medicine

## 2020-02-15 ENCOUNTER — Ambulatory Visit (INDEPENDENT_AMBULATORY_CARE_PROVIDER_SITE_OTHER): Payer: BC Managed Care – PPO | Admitting: Family Medicine

## 2020-02-15 ENCOUNTER — Other Ambulatory Visit: Payer: Self-pay

## 2020-02-15 VITALS — BP 137/89 | HR 91 | Temp 97.9°F | Resp 16 | Ht 69.0 in | Wt 249.6 lb

## 2020-02-15 DIAGNOSIS — E669 Obesity, unspecified: Secondary | ICD-10-CM | POA: Diagnosis not present

## 2020-02-15 DIAGNOSIS — R072 Precordial pain: Secondary | ICD-10-CM

## 2020-02-15 DIAGNOSIS — Z0000 Encounter for general adult medical examination without abnormal findings: Secondary | ICD-10-CM | POA: Diagnosis not present

## 2020-02-15 DIAGNOSIS — Z23 Encounter for immunization: Secondary | ICD-10-CM | POA: Diagnosis not present

## 2020-02-15 DIAGNOSIS — K2100 Gastro-esophageal reflux disease with esophagitis, without bleeding: Secondary | ICD-10-CM | POA: Diagnosis not present

## 2020-02-15 DIAGNOSIS — R0789 Other chest pain: Secondary | ICD-10-CM

## 2020-02-15 DIAGNOSIS — R030 Elevated blood-pressure reading, without diagnosis of hypertension: Secondary | ICD-10-CM

## 2020-02-15 NOTE — Addendum Note (Signed)
Addended by: Emi Holes D on: 02/15/2020 04:07 PM   Modules accepted: Orders

## 2020-02-15 NOTE — Patient Instructions (Signed)

## 2020-02-15 NOTE — Progress Notes (Signed)
Office Note 02/15/2020  CC:  Chief Complaint  Patient presents with  . Annual Exam    pt is fasting   HPI:  Daniel Cardenas is a 44 y.o. White male who is here for annual health maintenance exam. Had covid 19 inf a little over a month ago. All sx's resolved> wk ago, including cough. He does not some intermittent NONEXERTIONAL substernal chest tightness since having covid, though.  Mild, just bad enough to get his attention and make him wonder if something is going on with heart or lungs. He did go off of his PPI in 11/2019 and was having more GERD after that.  Since getting back on his PPI qd he notes that the chest tightness has improved/is less frequent. No associated pain, palpitations, SOB, nausea, diaphoresis, fever, or cough.  No hemoptysis.  No wheezing. He has remained active but no formal exercise regimen.  Has weaned himself off of his citalopram and after some initial increased anxiety/emotional sx's he is feeling better.  Past Medical History:  Diagnosis Date  . COVID-19 virus infection 12/2019  . Elevated alkaline phosphatase level    Abd u/s normal 04/01/17  . External hemorrhoid, bleeding   . GAD (generalized anxiety disorder)    citalopram helpful  . GERD (gastroesophageal reflux disease)    daily prilosec OTC helps  . H/O clavicle fracture 12/29/2018   ORIF 12/29/2018, revision 01/05/2019 b/c patient fell on clavicle AGAIN.  Marland Kitchen Hay fever   . History of depression   . Obesity, Class II, BMI 35-39.9   . OSA on CPAP 08/2014   Followed by pulm    Past Surgical History:  Procedure Laterality Date  . ORIF CLAVICULAR FRACTURE Right 12/28/2018   with revision 01/05/2019.  Procedure: OPEN REDUCTION INTERNAL FIXATION (ORIF) CLAVICULAR FRACTURE;  Surgeon: Mcarthur Rossetti, MD;  Location: [place];  Service: Orthopedics;  Laterality: Right;  . ORIF CLAVICULAR FRACTURE Right 01/05/2019   Procedure: OPEN REDUCTION INTERNAL FIXATION (ORIF)  RIGHT CLAVICLE FRACTURE;   Surgeon: Mcarthur Rossetti, MD;  Location: [facility];  Service: Orthopedics;  Laterality: Right;  . WISDOM TOOTH EXTRACTION  1998    Family History  Problem Relation Age of Onset  . Arthritis Mother   . Arthritis Father   . Hyperlipidemia Father   . Hypertension Father   . Mental illness Father        Frontotemporal degeneration  . Diabetes Father   . Stroke Maternal Grandfather   . Hypertension Paternal Grandfather   . Mental illness Paternal Grandfather        Multi-infarct dementia  . Diabetes Paternal Grandfather     Social History   Socioeconomic History  . Marital status: Married    Spouse name: [name]  . Number of children: 3  . Years of education: MD  . Highest education level: Not on file  Occupational History  . Occupation: ANESTHESIOLOGIST  Tobacco Use  . Smoking status: Never Smoker  . Smokeless tobacco: Never Used  Substance and Sexual Activity  . Alcohol use: Yes    Comment: occasional-- 1 beer every 3 days or so  . Drug use: No  . Sexual activity: Yes    Partners: Female  Other Topics Concern  . Not on file  Social History Narrative   Married, 4 children.   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  Residency--[employer] [facility] [facility] [employer]).   No tobacco.  Occ alcohol.  No drug use/abuse.   No exercise.   Social Determinants  of Health   Financial Resource Strain:   . Difficulty of Paying Living Expenses: Not on file  Food Insecurity:   . Worried About Programme researcher, broadcasting/film/video in the Last Year: Not on file  . Ran Out of Food in the Last Year: Not on file  Transportation Needs:   . Lack of Transportation (Medical): Not on file  . Lack of Transportation (Non-Medical): Not on file  Physical Activity:   . Days of Exercise per Week: Not on file  . Minutes of Exercise per Session: Not on file  Stress:   . Feeling of Stress : Not on file  Social Connections:   . Frequency of Communication with Friends and Family: Not on  file  . Frequency of Social Gatherings with Friends and Family: Not on file  . Attends Religious Services: Not on file  . Active Member of Clubs or Organizations: Not on file  . Attends Banker Meetings: Not on file  . Marital Status: Not on file  Intimate Partner Violence:   . Fear of Current or Ex-Partner: Not on file  . Emotionally Abused: Not on file  . Physically Abused: Not on file  . Sexually Abused: Not on file    Outpatient Medications Prior to Visit  Medication Sig Dispense Refill  . cetirizine (ZYRTEC) 10 MG tablet Take 10 mg by mouth daily.    Marland Kitchen esomeprazole (NEXIUM) 20 MG capsule Take 20 mg by mouth daily.    . cyclobenzaprine (FLEXERIL) 10 MG tablet Take 1 tablet (10 mg total) by mouth 3 (three) times daily as needed for muscle spasms. (Patient not taking: Reported on 02/15/2020) 30 tablet 0  . predniSONE (DELTASONE) 20 MG tablet 2 tabs po qd x 5d (Patient not taking: Reported on 02/15/2020) 10 tablet 0   No facility-administered medications prior to visit.    No Known Allergies  ROS Review of Systems  Constitutional: Negative for fatigue and fever.  HENT: Negative for congestion and sore throat.   Eyes: Negative for visual disturbance.  Respiratory: Positive for chest tightness. Negative for cough.   Cardiovascular: Negative for chest pain.  Gastrointestinal: Negative for abdominal pain and nausea.  Genitourinary: Negative for dysuria.  Musculoskeletal: Negative for back pain and joint swelling.  Skin: Negative for rash.  Neurological: Negative for weakness and headaches.  Hematological: Negative for adenopathy.    PE; Blood pressure 137/89, pulse 91, temperature 97.9 F (36.6 C), temperature source Temporal, resp. rate 16, height 5\' 9"  (1.753 m), weight 249 lb 9.6 oz (113.2 kg), SpO2 95 %. Body mass index is 36.86 kg/m.  Gen: Alert, well appearing.  Patient is oriented to person, place, time, and situation. AFFECT: pleasant, lucid thought and  speech. ENT: Ears: EACs clear, normal epithelium.  TMs with good light reflex and landmarks bilaterally.  Eyes: no injection, icteris, swelling, or exudate.  EOMI, PERRLA. Nose: no drainage or turbinate edema/swelling.  No injection or focal lesion.  Mouth: lips without lesion/swelling.  Oral mucosa pink and moist.  Dentition intact and without obvious caries or gingival swelling.  Oropharynx without erythema, exudate, or swelling.  Neck: supple/nontender.  No LAD, mass, or TM.   CV: RRR, no m/r/g.   LUNGS: CTA bilat, nonlabored resps, good aeration in all lung fields. ABD: soft, NT, ND, BS normal.  No hepatospenomegaly or mass.  No bruits. EXT: no clubbing, cyanosis, or edema.  Musculoskeletal: no joint swelling, erythema, warmth, or tenderness.  ROM of all joints intact. NO chest wall tenderness.  Skin - no sores or suspicious lesions or rashes or color changes   Pertinent labs:  Lab Results  Component Value Date   TSH 1.33 03/17/2017   Lab Results  Component Value Date   WBC 8.6 03/17/2017   HGB 15.7 03/17/2017   HCT 44.0 03/17/2017   MCV 90.7 03/17/2017   PLT 233.0 03/17/2017   Lab Results  Component Value Date   CREATININE 0.86 03/17/2017   BUN 11 03/17/2017   NA 139 03/17/2017   K 4.0 03/17/2017   CL 104 03/17/2017   CO2 27 03/17/2017   Lab Results  Component Value Date   ALT 23 03/17/2017   AST 23 03/17/2017   ALKPHOS 99 03/17/2017   BILITOT 1.7 (H) 03/17/2017   Lab Results  Component Value Date   CHOL 150 03/17/2017   Lab Results  Component Value Date   HDL 40.20 03/17/2017   Lab Results  Component Value Date   LDLCALC 87 03/17/2017   Lab Results  Component Value Date   TRIG 115.0 03/17/2017   Lab Results  Component Value Date   CHOLHDL 4 03/17/2017   12 lead EKG today:  (no prior EKG for comparison)->NSR, rate 80, no ischemic changes, no ectopy, no hypertrophy, no ST segment abnormalities. Normal intervals and duration.  Low voltage III and aVF.   NORMAL EKG.  ASSESSMENT AND PLAN:   1) Chest tightness; low suspicion of cardiopulmonary etiology. Suspect that this is from GERD with esophagitis. Improving since getting back on daily PPI. Take this daily for at least a month, then consider wean off med instead of abrupt cessation.  2) Elevated bp w/out dx HTN: he occ checks his bp and it is similar to or lower than today's measurement. He has had a year of lots of stress, fell off of his good eating habits, has not exercised any. He'll start working on TLCs. Continue periodic home bp checks, call if consistently elevated.  3) Health maintenance exam: Reviewed age and gender appropriate health maintenance issues (prudent diet, regular exercise, health risks of tobacco and excessive alcohol, use of seatbelts, fire alarms in home, use of sunscreen).  Also reviewed age and gender appropriate health screening as well as vaccine recommendations. Vaccines: Tdap due->given today.  Flu-->UTD. Labs: fasting HP labs ordered. Prostate ca screening: average risk patient= as per latest guidelines, start screening at 69 yrs of age. Colon ca screening: average risk patient= as per latest guidelines, start screening at 60 yrs of age.  An After Visit Summary was printed and given to the patient.  FOLLOW UP:  Return in about 1 year (around 02/14/2021) for annual CPE (fasting).  Signed:  Santiago Bumpers, MD           02/15/2020

## 2020-02-16 LAB — LIPID PANEL
Cholesterol: 166 mg/dL (ref <200)
HDL: 39 mg/dL — ABNORMAL LOW (ref >=40)
LDL Cholesterol (Calc): 106 mg/dL (calc) — ABNORMAL HIGH
Non-HDL Cholesterol (Calc): 127 mg/dL (calc) (ref <130)
Total CHOL/HDL Ratio: 4.3 (calc) (ref <5.0)
Triglycerides: 109 mg/dL (ref <150)

## 2020-02-16 LAB — CBC WITH DIFFERENTIAL/PLATELET
Absolute Monocytes: 677 cells/uL (ref 200–950)
Basophils Absolute: 91 cells/uL (ref 0–200)
Basophils Relative: 0.9 %
Eosinophils Absolute: 232 cells/uL (ref 15–500)
Eosinophils Relative: 2.3 %
HCT: 48.3 % (ref 38.5–50.0)
Hemoglobin: 17.3 g/dL — ABNORMAL HIGH (ref 13.2–17.1)
Lymphs Abs: 2838 cells/uL (ref 850–3900)
MCH: 33.4 pg — ABNORMAL HIGH (ref 27.0–33.0)
MCHC: 35.8 g/dL (ref 32.0–36.0)
MCV: 93.2 fL (ref 80.0–100.0)
MPV: 11.4 fL (ref 7.5–12.5)
Monocytes Relative: 6.7 %
Neutro Abs: 6262 cells/uL (ref 1500–7800)
Neutrophils Relative %: 62 %
Platelets: 265 10*3/uL (ref 140–400)
RBC: 5.18 10*6/uL (ref 4.20–5.80)
RDW: 12.3 % (ref 11.0–15.0)
Total Lymphocyte: 28.1 %
WBC: 10.1 10*3/uL (ref 3.8–10.8)

## 2020-02-16 LAB — COMPREHENSIVE METABOLIC PANEL
AG Ratio: 1.8 (calc) (ref 1.0–2.5)
ALT: 29 U/L (ref 9–46)
AST: 23 U/L (ref 10–40)
Albumin: 4.5 g/dL (ref 3.6–5.1)
Alkaline phosphatase (APISO): 104 U/L (ref 36–130)
BUN: 10 mg/dL (ref 7–25)
CO2: 23 mmol/L (ref 20–32)
Calcium: 9.4 mg/dL (ref 8.6–10.3)
Chloride: 105 mmol/L (ref 98–110)
Creat: 0.91 mg/dL (ref 0.60–1.35)
Globulin: 2.5 g/dL (calc) (ref 1.9–3.7)
Glucose, Bld: 77 mg/dL (ref 65–99)
Potassium: 4.3 mmol/L (ref 3.5–5.3)
Sodium: 139 mmol/L (ref 135–146)
Total Bilirubin: 1.7 mg/dL — ABNORMAL HIGH (ref 0.2–1.2)
Total Protein: 7 g/dL (ref 6.1–8.1)

## 2020-02-16 LAB — TSH: TSH: 1.79 mIU/L (ref 0.40–4.50)

## 2020-02-18 ENCOUNTER — Encounter: Payer: Self-pay | Admitting: Family Medicine

## 2020-04-02 ENCOUNTER — Other Ambulatory Visit: Payer: Self-pay

## 2020-04-02 ENCOUNTER — Ambulatory Visit (INDEPENDENT_AMBULATORY_CARE_PROVIDER_SITE_OTHER): Payer: BC Managed Care – PPO | Admitting: Family Medicine

## 2020-04-02 ENCOUNTER — Encounter: Payer: Self-pay | Admitting: Nurse Practitioner

## 2020-04-02 ENCOUNTER — Encounter: Payer: Self-pay | Admitting: Family Medicine

## 2020-04-02 VITALS — BP 121/83 | HR 83 | Temp 98.4°F | Resp 16 | Ht 69.0 in | Wt 247.2 lb

## 2020-04-02 DIAGNOSIS — R1013 Epigastric pain: Secondary | ICD-10-CM | POA: Diagnosis not present

## 2020-04-02 DIAGNOSIS — K2900 Acute gastritis without bleeding: Secondary | ICD-10-CM

## 2020-04-02 MED ORDER — ESOMEPRAZOLE MAGNESIUM 40 MG PO CPDR: 40.0000 mg | DELAYED_RELEASE_CAPSULE | Freq: Two times a day (BID) | ORAL | 1 refills | Status: DC

## 2020-04-02 NOTE — Progress Notes (Signed)
OFFICE VISIT  04/02/2020   CC:  Chief Complaint  Patient presents with  . Abdominal Pain    acidic foods make it worse   HPI:    Patient is a 44 y.o. Caucasian male who presents for abdominal pain. Recurrent mid upper abd pain the last couple weeks.  Takes buprofen on fairly regular basis for aches and pains.  ASA every once and a while, too.  Lots of stress with building new house the last year or so. Beer and coffee make him worse.  Pepto prn helps.  He has added pepcid to his daily nexium. Severe episode 3 d/a got his attention more and prompted this visit.   No melena, just black stools after he takes pepto. Has hx of GERD but this has not been bad lately.  ROS: no fevers, no CP, no SOB, no wheezing, no cough, no dizziness, no HAs, no rashes, no melena/hematochezia.  No polyuria or polydipsia.  No myalgias or arthralgias.  No focal weakness, paresthesias, or tremors.  No acute vision or hearing abnormalities. No n/v/d. No palpitations.     Past Medical History:  Diagnosis Date  . COVID-19 virus infection 12/2019  . Elevated alkaline phosphatase level    Abd u/s normal 04/01/17  . External hemorrhoid, bleeding   . GAD (generalized anxiety disorder)    citalopram helpful  . GERD (gastroesophageal reflux disease)    daily prilosec OTC helps  . H/O clavicle fracture 12/29/2018   ORIF 12/29/2018, revision 01/05/2019 b/c patient fell on clavicle AGAIN.  Marland Kitchen Hay fever   . History of depression   . Obesity, Class II, BMI 35-39.9   . OSA on CPAP 08/2014   Followed by pulm    Past Surgical History:  Procedure Laterality Date  . ORIF CLAVICULAR FRACTURE Right 12/28/2018   with revision 01/05/2019.  Procedure: OPEN REDUCTION INTERNAL FIXATION (ORIF) CLAVICULAR FRACTURE;  Surgeon: Kathryne Hitch, MD;  Location: [facility];  Service: Orthopedics;  Laterality: Right;  . ORIF CLAVICULAR FRACTURE Right 01/05/2019   Procedure: OPEN REDUCTION INTERNAL FIXATION (ORIF)  RIGHT CLAVICLE FRACTURE;   Surgeon: Kathryne Hitch, MD;  Location: [facility];  Service: Orthopedics;  Laterality: Right;  . WISDOM TOOTH EXTRACTION  1998    Outpatient Medications Prior to Visit  Medication Sig Dispense Refill  . cetirizine (ZYRTEC) 10 MG tablet Take 10 mg by mouth daily.    Marland Kitchen esomeprazole (NEXIUM) 20 MG capsule Take 20 mg by mouth daily.    . cyclobenzaprine (FLEXERIL) 10 MG tablet Take 1 tablet (10 mg total) by mouth 3 (three) times daily as needed for muscle spasms. (Patient not taking: Reported on 02/15/2020) 30 tablet 0   No facility-administered medications prior to visit.    No Known Allergies  ROS As per HPI  PE: Blood pressure 121/83, pulse 83, temperature 98.4 F (36.9 C), temperature source Temporal, resp. rate 16, height 5\' 9"  (1.753 m), weight 247 lb 3.2 oz (112.1 kg), SpO2 96 %. Body mass index is 36.51 kg/m.  Gen: Alert, well appearing.  Patient is oriented to person, place, time, and situation. AFFECT: pleasant, lucid thought and speech. : no injection, icteris, swelling, or exudate.  EOMI, PERRLA. Mouth: lips without lesion/swelling.  Oral mucosa pink and moist. Oropharynx without erythema, exudate, or swelling.  CV: RRR, no m/r/g.   LUNGS: CTA bilat, nonlabored resps, good aeration in all lung fields. ABD: soft, mild discomfort/tenderness to palpation in mid epigastric region, otherwise no abd TTP, ND, BS normal.  No hepatospenomegaly or mass.  No bruits. EXT: no clubbing or cyanosis.  no edema.  SKIN: no pallor or jaundice.  LABS:    Chemistry      Component Value Date/Time   NA 139 02/15/2020 1526   K 4.3 02/15/2020 1526   CL 105 02/15/2020 1526   CO2 23 02/15/2020 1526   BUN 10 02/15/2020 1526   CREATININE 0.91 02/15/2020 1526      Component Value Date/Time   CALCIUM 9.4 02/15/2020 1526   ALKPHOS 99 03/17/2017 1608   AST 23 02/15/2020 1526   ALT 29 02/15/2020 1526   BILITOT 1.7 (H) 02/15/2020 1526     Lab Results  Component Value Date    WBC 10.1 02/15/2020   HGB 17.3 (H) 02/15/2020   HCT 48.3 02/15/2020   MCV 93.2 02/15/2020   PLT 265 02/15/2020    IMPRESSION AND PLAN:  Epigastric pain c/w acute gastritis, possible PUD. NSAIDs + STRESS likely contributing factors.  Of course, possible H pylori as well. Plan is to increase nexium to 40mg  bid and stop the pepcid. Refer to GI, ? Possible EGD. Stop all NSAIDs, alcohol, acidic/irritating foods/drinks.  Pepto ok prn.  An After Visit Summary was printed and given to the patient.  FOLLOW UP: Return if symptoms worsen or fail to improve.  Signed:  Crissie Sickles, MD           04/02/2020

## 2020-04-15 ENCOUNTER — Encounter: Payer: Self-pay | Admitting: Nurse Practitioner

## 2020-04-15 ENCOUNTER — Other Ambulatory Visit (INDEPENDENT_AMBULATORY_CARE_PROVIDER_SITE_OTHER): Payer: BC Managed Care – PPO

## 2020-04-15 ENCOUNTER — Other Ambulatory Visit: Payer: Self-pay

## 2020-04-15 ENCOUNTER — Ambulatory Visit (INDEPENDENT_AMBULATORY_CARE_PROVIDER_SITE_OTHER): Payer: BC Managed Care – PPO | Admitting: Nurse Practitioner

## 2020-04-15 VITALS — BP 120/80 | HR 88 | Temp 98.7°F | Ht 69.0 in | Wt 246.2 lb

## 2020-04-15 DIAGNOSIS — R1013 Epigastric pain: Secondary | ICD-10-CM

## 2020-04-15 DIAGNOSIS — Z01818 Encounter for other preprocedural examination: Secondary | ICD-10-CM | POA: Diagnosis not present

## 2020-04-15 LAB — FERRITIN: Ferritin: 142.9 ng/mL (ref 22.0–322.0)

## 2020-04-15 LAB — BILIRUBIN, FRACTIONATED(TOT/DIR/INDIR)
Bilirubin, Direct: 0.2 mg/dL (ref 0.0–0.2)
Indirect Bilirubin: 1 mg/dL (calc) (ref 0.2–1.2)
Total Bilirubin: 1.2 mg/dL (ref 0.2–1.2)

## 2020-04-15 NOTE — Progress Notes (Signed)
ASSESSMENT / PLAN:   44 year old male with PMH significant for GERD, COVID-19 infection, GAD, OSA, right clavicle fracture  # Epigastric pain --Started around Easter in setting of NSAID use ( Ibuprofen and ASA) --Improved,  but unresolved despite high dose PPI and discontinuation of NSAIDs --Low to no suspicion for pancreatitis or hepatobiliary source of pain. Rule out PUD. The risks and benefits of EGD were discussed and the patient agrees to proceed.   # Longstanding GERD --Overall symptoms controlled with PPI.  --Barrett's esophagus screening at time of EGD.   # Hyperbilirubinemia --Chronic intermittent mild elevation in total bilirubin.  --Unremarkable RUQ [place] April 2018 --Will fractionate Bilirubin. If mainly indirect elevation then probably Gilbert's syndrome   # Elevated hemoglobin --minimally elevated on a couple of occasions. Volume related? He is a non-smoker.    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  He has a longstanding history of GERD, on chronic PPI.  In December patient wanted to see if he could manage GERD off medication.  Around the same time he has been taking ibuprofen for shoulder injury.  He has a history of Covid infection earlier this year and was empirically taken an aspirin for stroke prevention.  In March he was having recurrent GERD symptoms, restarted PPI.  Around Easter he developed severe nonradiating epigastric pain.  The "twisting" pain was nearly constant, worse after coffee.  When the pain was exceptionally bad he had some associated nausea without vomiting.  Patient was still on his PPI but added in Pepto-Bismol and Pepcid which helped but did not alleviate the pain.  He has no history of peptic ulcer disease.  No melena just dark stools after bismuth use.  Patient saw PCP on 04/02/2020.  NSAIDs were discontinued.  Pepcid stopped  and Nexium increased to twice daily.  He was referred to GI.  He says the pain has significantly improved but still not resolved.  Additionally, patient has a history of hyperbilirubinemia.  His alkaline phosphatase has been minimally elevated before but that was a few years back ( normal now).  His most recent labs drawn 02/15/2020 remarkable for total bilirubin of 1.7, Liver test otherwise normal. Hgb mildly elevated at 17.3, CBC o/w normal.  Of note, ultrasound in 2018 for abnormal LFTs and abdominal pain was unremarkable   Past Medical History:  Diagnosis Date  . COVID-19 virus infection 12/2019  . Elevated alkaline phosphatase level    Abd u/s normal 04/01/17  . External hemorrhoid, bleeding   . GAD (generalized anxiety disorder)    citalopram helpful  . GERD (gastroesophageal reflux disease)    daily prilosec OTC helps  . H/O clavicle fracture 12/29/2018   ORIF 12/29/2018, revision 01/05/2019 b/c patient fell on clavicle AGAIN.  Marland Kitchen Hay fever   . History of depression   . Obesity, Class II, BMI 35-39.9   . OSA on CPAP 08/2014   Followed by pulm     Past Surgical History:  Procedure Laterality Date  . ORIF CLAVICULAR FRACTURE Right 12/28/2018   with revision 01/05/2019.  Procedure: OPEN REDUCTION INTERNAL FIXATION (ORIF) CLAVICULAR FRACTURE;  Surgeon: Mcarthur Rossetti, MD;  Location: [facility];  Service: Orthopedics;  Laterality: Right;  . ORIF CLAVICULAR FRACTURE Right 01/05/2019   Procedure: OPEN REDUCTION INTERNAL FIXATION (ORIF)  RIGHT CLAVICLE FRACTURE;  Surgeon: Kathryne Hitch, MD;  Location: [facility];  Service: Orthopedics;  Laterality: Right;  . WISDOM TOOTH EXTRACTION  1998   Family History  Problem Relation Age of Onset  . Arthritis Mother   . Arthritis Father   . Hyperlipidemia Father   . Hypertension Father   . Mental illness Father        Frontotemporal degeneration  . Diabetes Father   . Stroke Maternal Grandfather   . Hypertension Paternal Grandfather   .  Mental illness Paternal Grandfather        Multi-infarct dementia  . Diabetes Paternal Grandfather   . Heart disease Paternal Grandfather   . Colon cancer Neg Hx   . Esophageal cancer Neg Hx    Social History   Tobacco Use  . Smoking status: Never Smoker  . Smokeless tobacco: Never Used  Substance Use Topics  . Alcohol use: Yes    Comment: occasional-- 1 beer every 3 days or so  . Drug use: No   Current Outpatient Medications  Medication Sig Dispense Refill  . cetirizine (ZYRTEC) 10 MG tablet Take 10 mg by mouth daily.    Marland Kitchen esomeprazole (NEXIUM) 40 MG capsule Take 1 capsule (40 mg total) by mouth 2 (two) times daily before a meal. 60 capsule 1   No current facility-administered medications for this visit.   No Known Allergies   Review of Systems: Positive for sinus trouble / allergies. All other systems reviewed and negative except where noted in HPI.   Creatinine clearance cannot be calculated (Patient's most recent lab result is older than the maximum 21 days allowed.)   Physical Exam:    Wt Readings from Last 3 Encounters:  04/15/20 246 lb 3.2 oz (111.7 kg)  04/02/20 247 lb 3.2 oz (112.1 kg)  02/15/20 249 lb 9.6 oz (113.2 kg)    BP 120/80   Pulse 88   Temp 98.7 F (37.1 C)   Ht 5\' 9"  (1.753 m)   Wt 246 lb 3.2 oz (111.7 kg)   BMI 36.36 kg/m  Constitutional:  Pleasant male in no acute distress. Psychiatric: Normal mood and affect. Behavior is normal. EENT: Pupils normal.  Conjunctivae are normal. No scleral icterus. Neck supple.  Cardiovascular: Normal rate, regular rhythm. No edema Pulmonary/chest: Effort normal and breath sounds normal. No wheezing, rales or rhonchi. Abdominal: Soft, nondistended, nontender. Bowel sounds active throughout. There are no masses palpable. No hepatomegaly. Neurological: Alert and oriented to person place and time. Skin: Skin is warm and dry. No rashes noted.  , NP  04/15/2020, 10:30 AM  Cc:  Referring  Provider McGowen, 04/17/2020, MD

## 2020-04-15 NOTE — Progress Notes (Signed)
____________________________________________________________  Attending physician addendum:  Thank you for sending this case to me and for asking me to see him with you in clinic today. I have reviewed the entire note, and the outlined plan is what we discussed.   Amada Jupiter, MD  ____________________________________________________________

## 2020-04-15 NOTE — Patient Instructions (Signed)
If you are age 42 or older, your body mass index should be between 23-30. Your Body mass index is 36.36 kg/m. If this is out of the aforementioned range listed, please consider follow up with your Primary Care Provider.  If you are age 12 or younger, your body mass index should be between 19-25. Your Body mass index is 36.36 kg/m. If this is out of the aformentioned range listed, please consider follow up with your Primary Care Provider.   You have been scheduled for an endoscopy. Please follow written instructions given to you at your visit today. If you use inhalers (even only as needed), please bring them with you on the day of your procedure.  Your provider has requested that you go to the basement level for lab work before leaving today. Press "B" on the elevator. The lab is located at the first door on the left as you exit the elevator.  Due to recent changes in healthcare laws, you may see the results of your imaging and laboratory studies on MyChart before your provider has had a chance to review them.  We understand that in some cases there may be results that are confusing or concerning to you. Not all laboratory results come back in the same time frame and the provider may be waiting for multiple results in order to interpret others.  Please give [place] 48 hours in order for your provider to thoroughly review all the results before contacting the office for clarification of your results.   Continue Nexium twice daily.  DO NOT take any NSAIDS at this time until further notice.

## 2020-04-16 ENCOUNTER — Encounter: Payer: Self-pay | Admitting: Family Medicine

## 2020-04-22 ENCOUNTER — Encounter: Payer: Self-pay | Admitting: Gastroenterology

## 2020-04-22 ENCOUNTER — Other Ambulatory Visit: Payer: Self-pay | Admitting: Gastroenterology

## 2020-04-22 ENCOUNTER — Ambulatory Visit (INDEPENDENT_AMBULATORY_CARE_PROVIDER_SITE_OTHER): Payer: BC Managed Care – PPO

## 2020-04-22 DIAGNOSIS — Z1159 Encounter for screening for other viral diseases: Secondary | ICD-10-CM

## 2020-04-23 LAB — SARS CORONAVIRUS 2 (TAT 6-24 HRS): SARS Coronavirus 2: NEGATIVE

## 2020-04-24 ENCOUNTER — Other Ambulatory Visit: Payer: Self-pay

## 2020-04-24 ENCOUNTER — Ambulatory Visit (AMBULATORY_SURGERY_CENTER): Payer: BC Managed Care – PPO | Admitting: Gastroenterology

## 2020-04-24 ENCOUNTER — Encounter: Payer: Self-pay | Admitting: Gastroenterology

## 2020-04-24 ENCOUNTER — Other Ambulatory Visit: Payer: Self-pay | Admitting: Family Medicine

## 2020-04-24 VITALS — BP 127/79 | HR 86 | Temp 97.7°F | Resp 11 | Ht 69.0 in | Wt 246.0 lb

## 2020-04-24 DIAGNOSIS — K219 Gastro-esophageal reflux disease without esophagitis: Secondary | ICD-10-CM | POA: Diagnosis not present

## 2020-04-24 DIAGNOSIS — K317 Polyp of stomach and duodenum: Secondary | ICD-10-CM

## 2020-04-24 DIAGNOSIS — K3189 Other diseases of stomach and duodenum: Secondary | ICD-10-CM | POA: Diagnosis not present

## 2020-04-24 DIAGNOSIS — R1013 Epigastric pain: Secondary | ICD-10-CM

## 2020-04-24 DIAGNOSIS — R12 Heartburn: Secondary | ICD-10-CM

## 2020-04-24 MED ORDER — SODIUM CHLORIDE 0.9 % IV SOLN: 500.0000 mL | Freq: Once | INTRAVENOUS | Status: DC

## 2020-04-24 NOTE — Progress Notes (Signed)
Temp-LC VS-KA 

## 2020-04-24 NOTE — Op Note (Signed)
[facility] Patient Name: Daniel Cardenas Procedure Date: 04/24/2020 3:31 PM MRN: [id number] Endoscopist: Mallie Mussel L. Loletha Carrow , MD Age: 44 Referring MD:  Date of Birth: Jun 28, 1976 Gender: Male Account #: [id number] Procedure:                Upper GI endoscopy Indications:              Epigastric abdominal pain, Heartburn (chronic PPI                            use for heartburn. epigastric pain worsened within                            last month - improved after stopping regular NSAID                            use, remains sensitive to certain foods) Medicines:                Monitored Anesthesia Care Procedure:                Pre-Anesthesia Assessment:                           - Prior to the procedure, a History and Physical                            was performed, and patient medications and                            allergies were reviewed. The patient's tolerance of                            previous anesthesia was also reviewed. The risks                            and benefits of the procedure and the sedation                            options and risks were discussed with the patient.                            All questions were answered, and informed consent                            was obtained. Prior Anticoagulants: The patient has                            taken no previous anticoagulant or antiplatelet                            agents. ASA Grade Assessment: II - A patient with                            mild systemic disease. After reviewing the risks  and benefits, the patient was deemed in                            satisfactory condition to undergo the procedure.                           After obtaining informed consent, the endoscope was                            passed under direct vision. Throughout the                            procedure, the patient's blood pressure, pulse, and                            oxygen  saturations were monitored continuously. The                            Endoscope was introduced through the mouth, and                            advanced to the second part of duodenum. The upper                            GI endoscopy was accomplished without difficulty.                            The patient tolerated the procedure well. Scope In: Scope Out: Findings:                 The larynx was normal.                           The esophagus was normal.                           A few small sessile fundic gland polyps were found                            in the gastric fundus and in the gastric body.                           Normal mucosa was found in the entire examined                            stomach. Biopsies were taken with a cold forceps                            for histology. ([place] protocol).                           The exam of the stomach was otherwise normal.                           The examined duodenum was  normal. Complications:            No immediate complications. Estimated Blood Loss:     Estimated blood loss was minimal. Impression:               - Normal esophagus.                           - A few fundic gland polyps.                           - Normal mucosa was found in the entire stomach.                            Biopsied.                           - Normal examined duodenum.                           Non-ulcer dyspepsia. Recommendation:           - Patient has a contact number available for                            emergencies. The signs and symptoms of potential                            delayed complications were discussed with the                            patient. Return to normal activities tomorrow.                            Written discharge instructions were provided to the                            patient.                           - Resume previous diet.                           - Continue present medications.                            - Await pathology results. Abria Vannostrand L. Myrtie Neither, MD 04/24/2020 4:00:00 PM This report has been signed electronically.

## 2020-04-24 NOTE — Progress Notes (Signed)
Called to room to assist during endoscopic procedure.  Patient ID and intended procedure confirmed with present staff. Received instructions for my participation in the procedure from the performing physician.  

## 2020-04-24 NOTE — Progress Notes (Signed)
Report to PACU, RN, vss, BBS= Clear.  

## 2020-04-24 NOTE — Patient Instructions (Signed)
YOU HAD AN ENDOSCOPIC PROCEDURE TODAY AT THE [facility]:   Refer to the procedure report that was given to you for any specific questions about what was found during the examination.  If the procedure report does not answer your questions, please call your gastroenterologist to clarify.  If you requested that your care partner not be given the details of your procedure findings, then the procedure report has been included in a sealed envelope for you to review at your convenience later.  YOU SHOULD EXPECT: Some feelings of bloating in the abdomen. Passage of more gas than usual.  Walking can help get rid of the air that was put into your GI tract during the procedure and reduce the bloating. If you had a lower endoscopy (such as a colonoscopy or flexible sigmoidoscopy) you may notice spotting of blood in your stool or on the toilet paper. If you underwent a bowel prep for your procedure, you may not have a normal bowel movement for a few days.  Please Note:  You might notice some irritation and congestion in your nose or some drainage.  This is from the oxygen used during your procedure.  There is no need for concern and it should clear up in a day or so.  SYMPTOMS TO REPORT IMMEDIATELY:    Following upper endoscopy (EGD)  Vomiting of blood or coffee ground material  New chest pain or pain under the shoulder blades  Painful or persistently difficult swallowing  New shortness of breath  Fever of 100F or higher  Black, tarry-looking stools  For urgent or emergent issues, a gastroenterologist can be reached at any hour by calling [phone]. Do not use MyChart messaging for urgent concerns.    DIET:  We do recommend a small meal at first, but then you may proceed to your regular diet.  Drink plenty of fluids but you should avoid alcoholic beverages for 24 hours.  ACTIVITY:  You should plan to take it easy for the rest of today and you should NOT DRIVE or use heavy machinery  until tomorrow (because of the sedation medicines used during the test).    FOLLOW UP: Our staff will call the number listed on your records 48-72 hours following your procedure to check on you and address any questions or concerns that you may have regarding the information given to you following your procedure. If we do not reach you, we will leave a message.  We will attempt to reach you two times.  During this call, we will ask if you have developed any symptoms of COVID 19. If you develop any symptoms (ie: fever, flu-like symptoms, shortness of breath, cough etc.) before then, please call [phone].  If you test positive for Covid 19 in the 2 weeks post procedure, please call and report this information to us.    If any biopsies were taken you will be contacted by phone or by letter within the next 1-3 weeks.  Please call us at [phone] if you have not heard about the biopsies in 3 weeks.    SIGNATURES/CONFIDENTIALITY: You and/or your care partner have signed paperwork which will be entered into your electronic medical record.  These signatures attest to the fact that that the information above on your After Visit Summary has been reviewed and is understood.  Full responsibility of the confidentiality of this discharge information lies with you and/or your care-partner. 

## 2020-04-26 HISTORY — PX: ESOPHAGOGASTRODUODENOSCOPY: SHX1529

## 2020-04-28 ENCOUNTER — Telehealth: Payer: Self-pay

## 2020-04-28 NOTE — Telephone Encounter (Signed)
1st follow up call made.  NALM 

## 2020-04-28 NOTE — Telephone Encounter (Signed)
Attempted to reach patient for post-procedure f/u call. No answer. Left message for him to please not hesitate to call us if he has any questions/concerns regarding his care. 

## 2020-04-29 ENCOUNTER — Encounter: Payer: Self-pay | Admitting: Gastroenterology

## 2020-05-12 ENCOUNTER — Encounter: Payer: Self-pay | Admitting: Family Medicine

## 2020-07-01 ENCOUNTER — Telehealth: Payer: Self-pay

## 2020-07-01 NOTE — Telephone Encounter (Signed)
Received call from patient's wife. Patient has been having chest pains. He feels it is due to stress. Transferred call to triage

## 2020-07-01 NOTE — Telephone Encounter (Signed)
Pt has been scheduled for in office appt tomorrow.  Patient Name: Daniel Cardenas Gender: Male DOB: 1976/09/13 Age: 44 Y 7 M 29 D Return Phone Number: [phone] (Primary) Address: City/State/Zip: [place] [address] Client [facility] Day - Client Client Site [facility] - Day Physician Santiago Bumpers - MD Contact Type Call Who Is Calling Patient / Member / Family / Caregiver Call Type Triage / Clinical Caller Name Juancarlos Crescenzo Relationship To Patient Spouse Return Phone Number [phone] (Primary) Chief Complaint CHEST PAIN (>=21 years) - pain, pressure, heaviness or tightness Reason for Call Symptomatic / Request for Health Information Initial Comment Caller states her husband is having chest pains. Translation No Nurse Assessment Nurse: Shon Baton, RN, Patrice Date/Time (Eastern Time): 07/01/2020 10:10:53 AM Confirm and document reason for call. If symptomatic, describe symptoms. ---Caller states her husband is having chest pain. He is at work. She is concerned because he has a lot of stress. He has c/o shoulder pain at times and c/o chest pain off and on. None today. Caller States she is sorry for calling she was really trying to get him an appointment or see if they want him to see a specialist. Advised if wanting triage, CP occurs to call back. Verbalized understanding. Has the patient had close contact with a person known or suspected to have the novel coronavirus illness OR traveled / lives in area with major community spread (including international travel) in the last 14 days from the onset of symptoms? * If Asymptomatic, screen for exposure and travel within the last 14 days. ---No Does the patient have any new or worsening symptoms? ---No Please document clinical information provided and list any resource used. ---Caller not with patient Pt. at work No chest pain at present. Guidelines Guideline Title Affirmed Question  Affirmed Notes Nurse Date/Time (Eastern Time) Disp. Time Lamount Cohen Time) Disposition Final User 07/01/2020 10:07:17 AM Send to Urgent Darleen Crocker, [facility] 07/01/2020 10:16:53 AM Clinical Call Yes Shon Baton, RN, Rodell Perna

## 2020-07-02 ENCOUNTER — Encounter: Payer: Self-pay | Admitting: Family Medicine

## 2020-07-02 ENCOUNTER — Ambulatory Visit (INDEPENDENT_AMBULATORY_CARE_PROVIDER_SITE_OTHER): Payer: BC Managed Care – PPO | Admitting: Family Medicine

## 2020-07-02 ENCOUNTER — Other Ambulatory Visit: Payer: Self-pay

## 2020-07-02 VITALS — BP 112/73 | HR 74 | Temp 98.4°F | Resp 16 | Ht 69.0 in | Wt 240.0 lb

## 2020-07-02 DIAGNOSIS — R079 Chest pain, unspecified: Secondary | ICD-10-CM | POA: Diagnosis not present

## 2020-07-02 NOTE — Progress Notes (Signed)
OFFICE VISIT  07/02/2020   CC:  Chief Complaint  Patient presents with  . Chest pain    x3-4 weeks, comes and goes   HPI:    Patient is a 44 y.o. Caucasian male who presents accompanied by his wife for chest pain. Occurring on and off for at least the last few weeks.  Not escalating in severity or frequency.   Pt describes it as "Crampy" chest pain in substernal and L side of chest. No known trigger or alleviating factors.  Can occur at rest or with movement.  The most stress he does on his body is walk up big hill behind his house but no consistent episodes of this CP from this.  Gets SOB with these walks up the hill, though.  Has occ feeling of irregular heartbeat but not related to the CP--has had these brief palpitations on rare occasion dating back 10 yrs or more.  No nausea or diaphoresis.  Hard to tell how long it lasts each time.  Pain lasts sometimes minutes and sometimes hours.  Left shoulder pain, chronic, says unrelated to any of his chest pain episodes.  No jaw pain.  Pain does not radiate into his back or abdomen.  Still lots of chronic stress but says he has never felt this type of physical feeling from stress in the past.  Hx of dyspepsia/gastritis/GERD---has been better overall since he's been on daily PPI. He takes no NSAIDs.  ROS: no fevers no wheezing, no cough, no dizziness, no HAs, no rashes, no melena/hematochezia.  No polyuria or polydipsia.  No myalgias or arthralgias.  No focal weakness, paresthesias, or tremors.  No acute vision or hearing abnormalities. No n/v/d or abd pain.      Past Medical History:  Diagnosis Date  . COVID-19 virus infection 12/2019  . Depression   . Elevated alkaline phosphatase level    Abd u/s normal 04/01/17  . Epigastric pain    EGD 04/2020 normal except a few gastric polyps-->nonulcer dyspepsia  . External hemorrhoid, bleeding   . GAD (generalized anxiety disorder)    citalopram helpful  . GERD (gastroesophageal reflux disease)     daily prilosec OTC helps  . H/O clavicle fracture 12/29/2018   ORIF 12/29/2018, revision 01/05/2019 b/c patient fell on clavicle AGAIN.  Marland Kitchen Hay fever   . History of depression   . Obesity, Class II, BMI 35-39.9   . OSA on CPAP 08/2014   Followed by pulm  . Sleep apnea    uses CPAP    Past Surgical History:  Procedure Laterality Date  . ESOPHAGOGASTRODUODENOSCOPY  04/2020   NORMAL except a few gastric polyps.  Gastric bx: mild reactive gastropathy, H pylori NEG  . ORIF CLAVICULAR FRACTURE Right 12/28/2018   with revision 01/05/2019.  Procedure: OPEN REDUCTION INTERNAL FIXATION (ORIF) CLAVICULAR FRACTURE;  Surgeon: Kathryne Hitch, MD;  Location: [facility];  Service: Orthopedics;  Laterality: Right;  . ORIF CLAVICULAR FRACTURE Right 01/05/2019   Procedure: OPEN REDUCTION INTERNAL FIXATION (ORIF)  RIGHT CLAVICLE FRACTURE;  Surgeon: Kathryne Hitch, MD;  Location: [facility];  Service: Orthopedics;  Laterality: Right;  . WISDOM TOOTH EXTRACTION  1998    Outpatient Medications Prior to Visit  Medication Sig Dispense Refill  . cetirizine (ZYRTEC) 10 MG tablet Take 10 mg by mouth daily.    Marland Kitchen esomeprazole (NEXIUM) 40 MG capsule Take 1 capsule (40 mg total) by mouth 2 (two) times daily before a meal. 60 capsule 1   No facility-administered medications  prior to visit.   Family History  Problem Relation Age of Onset  . Arthritis Mother   . Arthritis Father   . Hyperlipidemia Father   . Hypertension Father   . Mental illness Father        Frontotemporal degeneration  . Diabetes Father   . Stroke Maternal Grandfather   . Hypertension Paternal Grandfather   . Mental illness Paternal Grandfather        Multi-infarct dementia  . Diabetes Paternal Grandfather   . Heart disease Paternal Grandfather   . Colon cancer Neg Hx   . Esophageal cancer Neg Hx   . Rectal cancer Neg Hx   . Stomach cancer Neg Hx    Social History   Socioeconomic History  . Marital status: Married    Spouse  name: [name]  . Number of children: 3  . Years of education: MD  . Highest education level: Not on file  Occupational History  . Occupation: ANESTHESIOLOGIST  Tobacco Use  . Smoking status: Never Smoker  . Smokeless tobacco: Never Used  Vaping Use  . Vaping Use: Never used  Substance and Sexual Activity  . Alcohol use: Yes    Comment: occasional-- 1 beer every 3 days or so  . Drug use: No  . Sexual activity: Yes    Partners: Female  Other Topics Concern  . Not on file  Social History Narrative   Married, 4 children.   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  Residency--[employer]).   No tobacco.  Occ alcohol.  No drug use/abuse.   No exercise.   Social Determinants of Health   Financial Resource Strain:   . Difficulty of Paying Living Expenses:   Food Insecurity:   . Worried About Programme researcher, broadcasting/film/video in the Last Year:   . Barista in the Last Year:   Transportation Needs:   . Freight forwarder (Medical):   Marland Kitchen Lack of Transportation (Non-Medical):   Physical Activity:   . Days of Exercise per Week:   . Minutes of Exercise per Session:   Stress:   . Feeling of Stress :   Social Connections:   . Frequency of Communication with Friends and Family:   . Frequency of Social Gatherings with Friends and Family:   . Attends Religious Services:   . Active Member of Clubs or Organizations:   . Attends Banker Meetings:   Marland Kitchen Marital Status:     No Known Allergies  ROS As per HPI  PE: Vitals with BMI 07/02/2020 04/24/2020 04/24/2020  Height 5\' 9"  - -  Weight 240 lbs - -  BMI 35.43 - -  Systolic 112 127  Diastolic 73 79 72  Pulse 74 86 84  O2 sat on RA today is 98%  Gen: Alert, well appearing.  Patient is oriented to person, place, time, and situation. AFFECT: pleasant, lucid thought and speech. 883: no injection, icteris, swelling, or exudate.  EOMI, PERRLA. Mouth: lips without  lesion/swelling.  Oral mucosa pink and moist. Oropharynx without erythema, exudate, or swelling.  Neck - No masses or thyromegaly or limitation in range of motion Carotids 2+, no bruit. CV: RRR, no m/r/g.   LUNGS: CTA bilat, nonlabored resps, good aeration in all lung fields. ABD: soft, NT/ND EXT: no clubbing or cyanosis.  no edema.    LABS:  Lab Results  Component Value Date   TSH 1.79 02/15/2020   Lab Results  Component Value Date  WBC 10.1 02/15/2020   HGB 17.3 (H) 02/15/2020   HCT 48.3 02/15/2020   MCV 93.2 02/15/2020   PLT 265 02/15/2020   Lab Results  Component Value Date   CREATININE 0.91 02/15/2020   BUN 10 02/15/2020   NA 139 02/15/2020   K 4.3 02/15/2020   CL 105 02/15/2020   CO2 23 02/15/2020   Lab Results  Component Value Date   ALT 29 02/15/2020   AST 23 02/15/2020   ALKPHOS 99 03/17/2017   BILITOT 1.2 04/15/2020   Lab Results  Component Value Date   CHOL 166 02/15/2020   Lab Results  Component Value Date   HDL 39 (L) 02/15/2020   Lab Results  Component Value Date   LDLCALC 106 (H) 02/15/2020   Lab Results  Component Value Date   TRIG 109 02/15/2020   Lab Results  Component Value Date   CHOLHDL 4.3 02/15/2020   12 lead EKG today: NSR, rate 69, no ischemic changes, no Q waves, no hypertrophy or ectopy.  Normal intervals and duration.  Low voltage III and aVF.  Compared to EKG 02/15/20 there is NO CHANGE.  IMPRESSION AND PLAN:  Atypical CP.   EKG normal. CXR ordered. CBC w/diff and CMET today. Referral to Cardiology for risk stratification.  An After Visit Summary was printed and given to the patient.  FOLLOW UP: Return for f/u to be determined based on results of w/u.  Signed:  Santiago Bumpers, MD           07/02/2020

## 2020-07-03 LAB — COMPREHENSIVE METABOLIC PANEL
ALT: 30 U/L (ref 0–53)
AST: 24 U/L (ref 0–37)
Albumin: 4.6 g/dL (ref 3.5–5.2)
Alkaline Phosphatase: 99 U/L (ref 39–117)
BUN: 11 mg/dL (ref 6–23)
CO2: 28 mEq/L (ref 19–32)
Calcium: 9.4 mg/dL (ref 8.4–10.5)
Chloride: 105 mEq/L (ref 96–112)
Creatinine, Ser: 0.89 mg/dL (ref 0.40–1.50)
GFR: 93.89 mL/min (ref >60.00)
Glucose, Bld: 78 mg/dL (ref 70–99)
Potassium: 4.1 mEq/L (ref 3.5–5.1)
Sodium: 139 mEq/L (ref 135–145)
Total Bilirubin: 1.6 mg/dL — ABNORMAL HIGH (ref 0.2–1.2)
Total Protein: 6.9 g/dL (ref 6.0–8.3)

## 2020-07-03 LAB — CBC WITH DIFFERENTIAL/PLATELET
Basophils Absolute: 0.1 10*3/uL (ref 0.0–0.1)
Basophils Relative: 0.8 % (ref 0.0–3.0)
Eosinophils Absolute: 0.4 10*3/uL (ref 0.0–0.7)
Eosinophils Relative: 5 % (ref 0.0–5.0)
HCT: 45.2 % (ref 39.0–52.0)
Hemoglobin: 16 g/dL (ref 13.0–17.0)
Lymphocytes Relative: 28.9 % (ref 12.0–46.0)
Lymphs Abs: 2.4 10*3/uL (ref 0.7–4.0)
MCHC: 35.5 g/dL (ref 30.0–36.0)
MCV: 93.2 fl (ref 78.0–100.0)
Monocytes Absolute: 0.7 10*3/uL (ref 0.1–1.0)
Monocytes Relative: 8.2 % (ref 3.0–12.0)
Neutro Abs: 4.8 10*3/uL (ref 1.4–7.7)
Neutrophils Relative %: 57.1 % (ref 43.0–77.0)
Platelets: 213 10*3/uL (ref 150.0–400.0)
RBC: 4.84 Mil/uL (ref 4.22–5.81)
RDW: 13.1 % (ref 11.5–15.5)
WBC: 8.5 10*3/uL (ref 4.0–10.5)

## 2020-07-04 ENCOUNTER — Ambulatory Visit (HOSPITAL_BASED_OUTPATIENT_CLINIC_OR_DEPARTMENT_OTHER)
Admission: RE | Admit: 2020-07-04 | Discharge: 2020-07-04 | Disposition: A | Discharge status: Home / Self Care | Payer: BC Managed Care – PPO | Source: Ambulatory Visit | Attending: Family Medicine | Admitting: Family Medicine

## 2020-07-04 ENCOUNTER — Other Ambulatory Visit: Payer: Self-pay

## 2020-07-04 DIAGNOSIS — R079 Chest pain, unspecified: Secondary | ICD-10-CM | POA: Insufficient documentation

## 2020-07-22 DIAGNOSIS — Z20828 Contact with and (suspected) exposure to other viral communicable diseases: Secondary | ICD-10-CM | POA: Diagnosis not present

## 2020-08-13 ENCOUNTER — Encounter: Payer: Self-pay | Admitting: Cardiology

## 2020-08-13 DIAGNOSIS — Z7189 Other specified counseling: Secondary | ICD-10-CM | POA: Insufficient documentation

## 2020-08-13 DIAGNOSIS — R072 Precordial pain: Secondary | ICD-10-CM | POA: Insufficient documentation

## 2020-08-13 NOTE — Progress Notes (Addendum)
Cardiology Office Note   Date:  08/14/2020   ID:  Daniel Cardenas, DOB 13-Sep-1976, MRN [id number]  PCP:  Jeoffrey Massed, MD  Cardiologist:   No primary care provider on file.   Chief Complaint  Patient presents with  . Chest Pain      History of Present Illness: Daniel Cardenas is a 44 y.o. male who is referred by Jeoffrey Massed, MD for evaluation of chest pain.  The patient is an anesthesiologist.  He has had no prior cardiac history.  He noticed chest discomfort after he has coronavirus.  Prior to that he had been on a PPI but had stopped this as he gotten better.  He had coronavirus which he thought was a somewhat mild case.  However, following this his reflux got worse and restarted PPI.  He also started having some other chest discomfort that was different than his previous reflux.  He described the mid discomfort.  It happens somewhat sporadically.  He has noticed it when he is emotionally stressed.  He is also had some discomfort when he might walk up an incline in his yard.  This is not always reproducible.  He does yard work at other times without symptoms.  If he does get this discomfort it might be 3 out of 10.  It might last for about 10 minutes.  It is mid chest.  There is no radiation to his neck or to his arms.  He will get mildly short of breath with this but is not getting any nausea vomiting or diaphoresis.  He is not reporting any palpitations, presyncope or syncope.  He does have stress done in the house, with his job and has 4 children.  Past Medical History:  Diagnosis Date  . COVID-19 virus infection 12/2019  . Depression   . Elevated alkaline phosphatase level    Abd u/s normal 04/01/17  . Epigastric pain    EGD 04/2020 normal except a few gastric polyps-->nonulcer dyspepsia  . External hemorrhoid, bleeding   . GAD (generalized anxiety disorder)    citalopram helpful  . GERD (gastroesophageal reflux disease)    daily prilosec OTC helps  . H/O clavicle  fracture 12/29/2018   ORIF 12/29/2018, revision 01/05/2019 b/c patient fell on clavicle AGAIN.  Marland Kitchen Hay fever   . History of depression   . Obesity, Class II, BMI 35-39.9   . OSA on CPAP 08/2014   Followed by pulm    Past Surgical History:  Procedure Laterality Date  . ESOPHAGOGASTRODUODENOSCOPY  04/2020   NORMAL except a few gastric polyps.  Gastric bx: mild reactive gastropathy, H pylori NEG  . ORIF CLAVICULAR FRACTURE Right 12/28/2018   with revision 01/05/2019.  Procedure: OPEN REDUCTION INTERNAL FIXATION (ORIF) CLAVICULAR FRACTURE;  Surgeon: Kathryne Hitch, MD;  Location: [facility];  Service: Orthopedics;  Laterality: Right;  . ORIF CLAVICULAR FRACTURE Right 01/05/2019   Procedure: OPEN REDUCTION INTERNAL FIXATION (ORIF)  RIGHT CLAVICLE FRACTURE;  Surgeon: Kathryne Hitch, MD;  Location: [facility];  Service: Orthopedics;  Laterality: Right;  . WISDOM TOOTH EXTRACTION  1998     Current Outpatient Medications  Medication Sig Dispense Refill  . cetirizine (ZYRTEC) 10 MG tablet Take 10 mg by mouth daily.    Marland Kitchen esomeprazole (NEXIUM) 40 MG capsule Take 1 capsule (40 mg total) by mouth 2 (two) times daily before a meal. 60 capsule 1   No current facility-administered medications for this visit.    Allergies:  Patient has no known allergies.    Social History:  The patient  reports that he has never smoked. He has never used smokeless tobacco. He reports current alcohol use. He reports that he does not use drugs.   Family History:  The patient's family history includes Arthritis in his father and mother; Dementia in his father; Diabetes in his father and paternal grandfather; Heart disease in his paternal grandfather; Hyperlipidemia in his father; Hypertension in his father and paternal grandfather; Mental illness in his paternal grandfather; Stroke in his maternal grandfather.    ROS:  Please see the history of present illness.   Otherwise, review of systems are positive for none.    All other systems are reviewed and negative.    PHYSICAL EXAM: VS:  BP (!) 140/98   Pulse 81   Temp 97.7 F (36.5 C)   Ht 5\' 9"  (1.753 m)   Wt 240 lb (108.9 kg)   SpO2 96%   BMI 35.44 kg/m  , BMI Body mass index is 35.44 kg/m. GENERAL:  Well appearing HEENT:  Pupils equal round and reactive, fundi not visualized, oral mucosa unremarkable NECK:  No jugular venous distention, waveform within normal limits, carotid upstroke brisk and symmetric, no bruits, no thyromegaly LYMPHATICS:  No cervical, inguinal adenopathy LUNGS:  Clear to auscultation bilaterally BACK:  No CVA tenderness CHEST:  Unremarkable HEART:  PMI not displaced or sustained,S1 and S2 within normal limits, no S3, no S4, no clicks, no rubs, no murmurs ABD:  Flat, positive bowel sounds normal in frequency in pitch, no bruits, no rebound, no guarding, no midline pulsatile mass, no hepatomegaly, no splenomegaly EXT:  2 plus pulses throughout, no edema, no cyanosis no clubbing SKIN:  No rashes no nodules NEURO:  Cranial nerves II through XII grossly intact, motor grossly intact throughout PSYCH:  Cognitively intact, oriented to person place and time    EKG:  EKG is ordered today. The ekg ordered today demonstrates sinus rhythm, rate 83, axis within normal limits, intervals within normal limits, inferior T wave inversions lead III and aVF that are slightly more pronounced than previous but nonspecific.   Recent Labs: 02/15/2020: TSH 1.79 07/02/2020: ALT 30; BUN 11; Creatinine, Ser 0.89; Hemoglobin 16.0; Platelets 213.0; Potassium 4.1; Sodium 139    Lipid Panel    Component Value Date/Time   CHOL 166 02/15/2020 1526   TRIG 109 02/15/2020 1526   HDL 39 (L) 02/15/2020 1526   CHOLHDL 4.3 02/15/2020 1526   VLDL 23.0 03/17/2017 1608   LDLCALC 106 (H) 02/15/2020 1526      Wt Readings from Last 3 Encounters:  08/14/20 240 lb (108.9 kg)  07/02/20 240 lb (108.9 kg)  04/24/20 246 lb (111.6 kg)      Other studies  Reviewed: Additional studies/ records that were reviewed today include: Labs. Review of the above records demonstrates:  Please see elsewhere in the note.     ASSESSMENT AND PLAN:  CHEST PAIN:   Given the description of the pain and the slightly abnormal chest EKG any further testing.  I will order a POET (Plain Old Exercise Treadmill).  I will also order a coronary calcium score.  RISK REDUCTION: His LDL is slightly above ideal at 106.  HDL is 39.  Goals of therapy will be based on the results of the above testing.  Current medicines are reviewed at length with the patient today.  The patient does not have concerns regarding medicines.  The following changes have been made:  no change  Labs/ tests ordered today include:   Orders Placed This Encounter  Procedures  . CT CARDIAC SCORING  . EXERCISE TOLERANCE TEST (ETT)  . EKG 12-Lead     Disposition:   FU with me as needed.      Signed, Rollene Rotunda, MD  08/14/2020 12:24 PM    [facility]

## 2020-08-14 ENCOUNTER — Ambulatory Visit (INDEPENDENT_AMBULATORY_CARE_PROVIDER_SITE_OTHER): Payer: BC Managed Care – PPO | Admitting: Cardiology

## 2020-08-14 ENCOUNTER — Other Ambulatory Visit: Payer: Self-pay

## 2020-08-14 ENCOUNTER — Encounter: Payer: Self-pay | Admitting: Cardiology

## 2020-08-14 VITALS — BP 140/98 | HR 81 | Temp 97.7°F | Ht 69.0 in | Wt 240.0 lb

## 2020-08-14 DIAGNOSIS — R072 Precordial pain: Secondary | ICD-10-CM

## 2020-08-14 DIAGNOSIS — Z7189 Other specified counseling: Secondary | ICD-10-CM

## 2020-08-14 NOTE — Patient Instructions (Addendum)
Medication Instructions:  No changes *If you need a refill on your cardiac medications before your next appointment, please call your pharmacy*  Lab Work: None ordered this visit  Testing/Procedures: CT Calcium Score  Your physician has requested that you have an exercise tolerance test. For further information please visit [email or web address] Please also follow instruction sheet, as given.  You will need a Covid Screening 3 days before your exercise tolerance test.   Follow-Up: At [facility], you and your health needs are our priority.  As part of our continuing mission to provide you with exceptional heart care, we have created designated Provider Care Teams.  These Care Teams include your primary Cardiologist (physician) and Advanced Practice Providers (APPs -  Physician Assistants and Nurse Practitioners) who all work together to provide you with the care you need, when you need it.  Your next appointment:   Follow up as needed  Other Instructions Dr. Antoine Poche has ordered a CT coronary calcium score. This test is done at [address]. This is $150 out of pocket. Coronary CalciumScan A coronary calcium scan is an imaging test used to look for deposits of calcium and other fatty materials (plaques) in the inner lining of the blood vessels of the heart (coronary arteries). These deposits of calcium and plaques can partly clog and narrow the coronary arteries without producing any symptoms or warning signs. This puts a person at risk for a heart attack. This test can detect these deposits before symptoms develop. Tell a health care provider about:  Any allergies you have.  All medicines you are taking, including vitamins, herbs, eye drops, creams, and over-the-counter medicines.  Any problems you or family members have had with anesthetic medicines.  Any blood disorders you have.  Any surgeries you have had.  Any medical conditions you have.  Whether you  are pregnant or may be pregnant. What are the risks? Generally, this is a safe procedure. However, problems may occur, including:  Harm to a pregnant woman and her unborn baby. This test involves the use of radiation. Radiation exposure can be dangerous to a pregnant woman and her unborn baby. If you are pregnant, you generally should not have this procedure done.  Slight increase in the risk of cancer. This is because of the radiation involved in the test. What happens before the procedure? No preparation is needed for this procedure. What happens during the procedure?  You will undress and remove any jewelry around your neck or chest.  You will put on a hospital gown.  Sticky electrodes will be placed on your chest. The electrodes will be connected to an electrocardiogram (ECG) machine to record a tracing of the electrical activity of your heart.  A CT scanner will take pictures of your heart. During this time, you will be asked to lie still and hold your breath for 2-3 seconds while a picture of your heart is being taken. The procedure may vary among health care providers and hospitals. What happens after the procedure?  You can get dressed.  You can return to your normal activities.  It is up to you to get the results of your test. Ask your health care provider, or the department that is doing the test, when your results will be ready. Summary  A coronary calcium scan is an imaging test used to look for deposits of calcium and other fatty materials (plaques) in the inner lining of the blood vessels of the heart (coronary arteries).  Generally, this is a safe procedure. Tell your health care provider if you are pregnant or may be pregnant.  No preparation is needed for this procedure.  A CT scanner will take pictures of your heart.  You can return to your normal activities after the scan is done. This information is not intended to replace advice given to you by your health  care provider. Make sure you discuss any questions you have with your health care provider. Document Released: 06/10/2008 Document Revised: 11/01/2016 Document Reviewed: 11/01/2016 Elsevier Interactive Patient Education  2017 [employer].

## 2020-08-21 ENCOUNTER — Telehealth (HOSPITAL_COMMUNITY): Payer: Self-pay

## 2020-08-21 NOTE — Telephone Encounter (Signed)
Encounter complete. 

## 2020-08-23 ENCOUNTER — Other Ambulatory Visit (HOSPITAL_COMMUNITY)
Admission: RE | Admit: 2020-08-23 | Discharge: 2020-08-23 | Disposition: A | Discharge status: Home / Self Care | Payer: BC Managed Care – PPO | Source: Ambulatory Visit | Attending: Internal Medicine | Admitting: Internal Medicine

## 2020-08-23 DIAGNOSIS — Z01812 Encounter for preprocedural laboratory examination: Secondary | ICD-10-CM | POA: Diagnosis not present

## 2020-08-23 DIAGNOSIS — Z20822 Contact with and (suspected) exposure to covid-19: Secondary | ICD-10-CM | POA: Diagnosis not present

## 2020-08-23 LAB — SARS CORONAVIRUS 2 (TAT 6-24 HRS): SARS Coronavirus 2: NEGATIVE

## 2020-08-26 ENCOUNTER — Ambulatory Visit (HOSPITAL_COMMUNITY)
Admission: RE | Admit: 2020-08-26 | Discharge: 2020-08-26 | Disposition: A | Discharge status: Home / Self Care | Payer: BC Managed Care – PPO | Source: Ambulatory Visit | Attending: Internal Medicine | Admitting: Internal Medicine

## 2020-08-26 ENCOUNTER — Other Ambulatory Visit: Payer: Self-pay

## 2020-08-26 DIAGNOSIS — R072 Precordial pain: Secondary | ICD-10-CM | POA: Diagnosis not present

## 2020-08-26 LAB — EXERCISE TOLERANCE TEST
Estimated workload: 10.6 METS
Exercise duration (min): 9 min
Exercise duration (sec): 21 s
MPHR: 179 {beats}/min
Peak HR: 169 {beats}/min
Percent HR: 94 %
Rest HR: 85 {beats}/min

## 2020-08-27 HISTORY — PX: OTHER SURGICAL HISTORY: SHX169

## 2020-09-02 ENCOUNTER — Encounter (HOSPITAL_COMMUNITY): Payer: BC Managed Care – PPO

## 2020-09-04 ENCOUNTER — Ambulatory Visit (INDEPENDENT_AMBULATORY_CARE_PROVIDER_SITE_OTHER)
Admission: RE | Admit: 2020-09-04 | Discharge: 2020-09-04 | Disposition: A | Discharge status: Home / Self Care | Payer: Self-pay | Source: Ambulatory Visit | Attending: Cardiology | Admitting: Cardiology

## 2020-09-04 ENCOUNTER — Other Ambulatory Visit: Payer: Self-pay

## 2020-09-04 DIAGNOSIS — R072 Precordial pain: Secondary | ICD-10-CM

## 2020-09-04 HISTORY — PX: OTHER SURGICAL HISTORY: SHX169

## 2021-03-17 IMAGING — CT CT CARDIAC CORONARY ARTERY CALCIUM SCORE
3 series · 14 of 20 positions shown, 15 images · non-contrast
Comparison: No priors.
COMPARISON: No priors.

Addendum:
EXAM:
OVER-READ INTERPRETATION  CT CHEST

The following report is an over-read performed by radiologist Dr.
Mehaad Tork [REDACTED] on 09/04/2020. This
over-read does not include interpretation of cardiac or coronary
anatomy or pathology. The coronary calcium score interpretation by
the cardiologist is attached.
CLINICAL DATA: Risk stratification
Coronary Calcium Score
TECHNIQUE: The patient was scanned on a Siemens Force scanner. Axial
non-contrast 3 mm slices were carried out through the heart. The
data set was analyzed on a dedicated work station and scored using
the Agatson method.

[Series 2: casc 3.0 bv41 2 bestsyst 36 % · axial · 0.38mm/px · z∈[-249,-168]mm · 4 of 47 slices shown, 5 images]
[im 10/47  vessel]
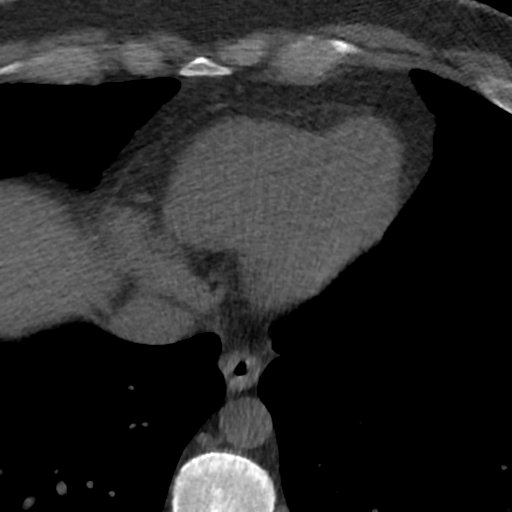
[im 10/47  lung]
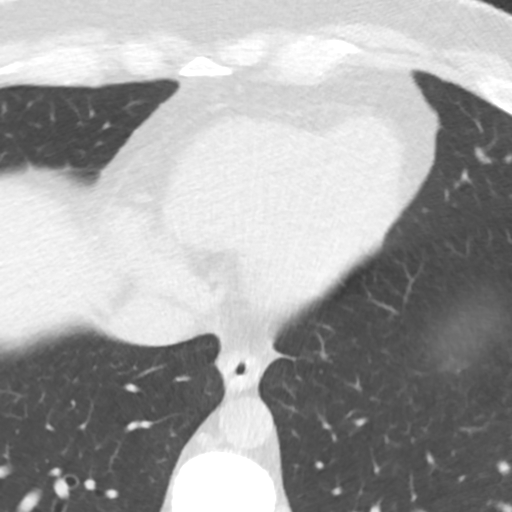
[im 19/47  vessel]
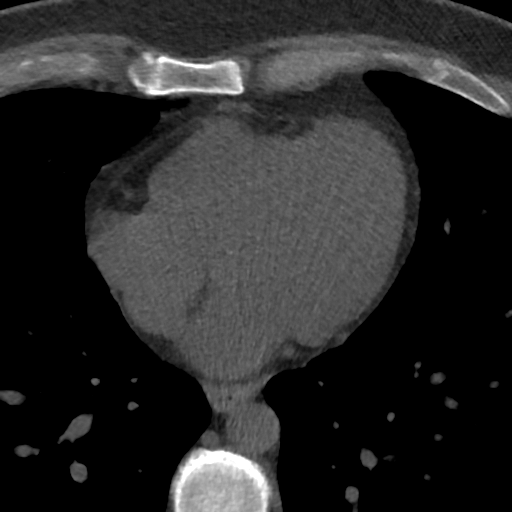
[im 28/47  vessel]
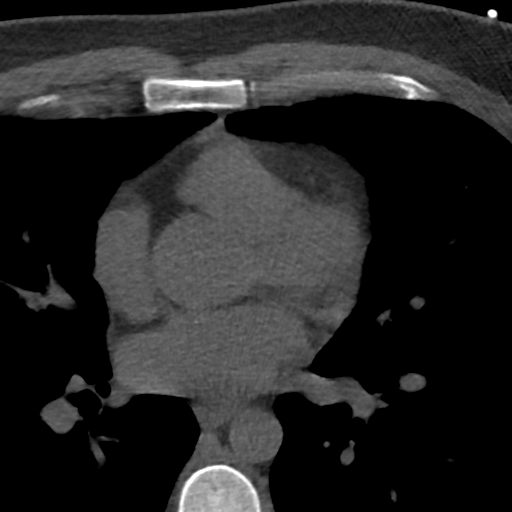
[im 37/47  vessel]
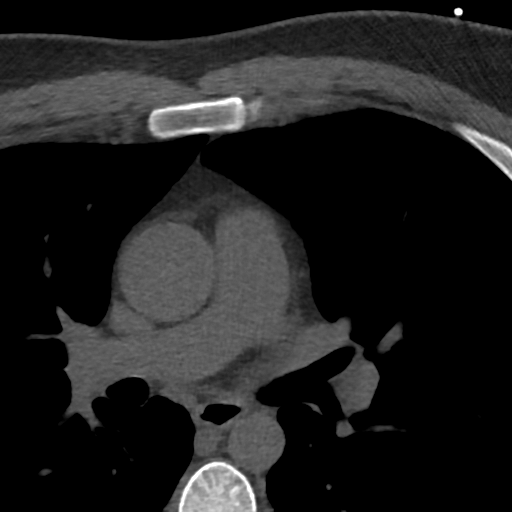

[Series 3: lung 36 % · axial · 0.77mm/px · z∈[-254,-162]mm · 5 of 47 slices shown]
[im 8/47  lung]
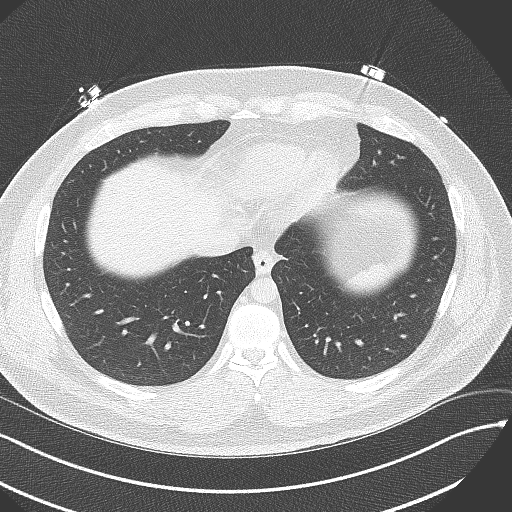
[im 16/47  lung]
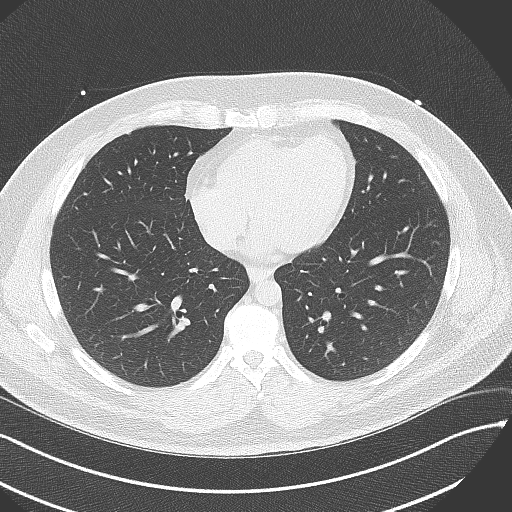
[im 24/47  lung]
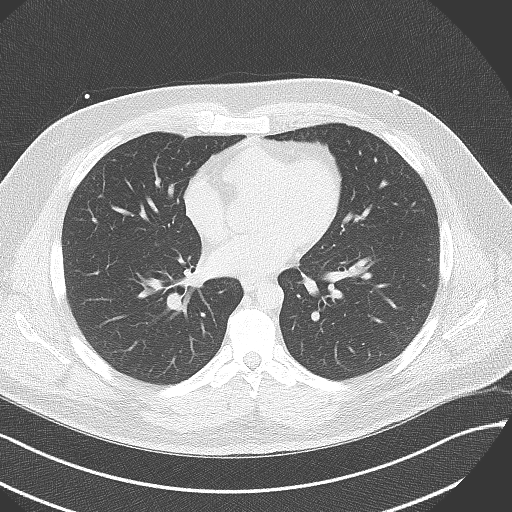
[im 31/47  lung]
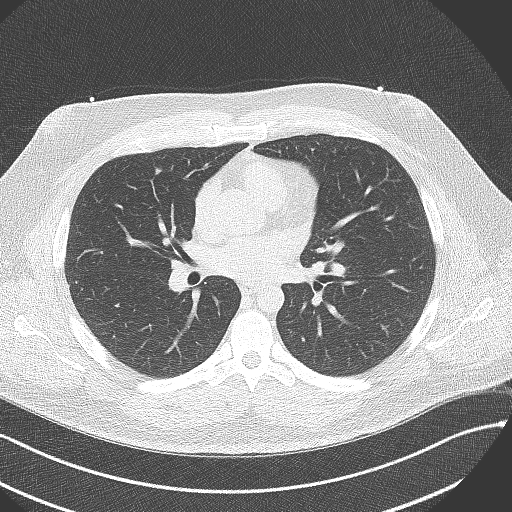
[im 39/47  lung]
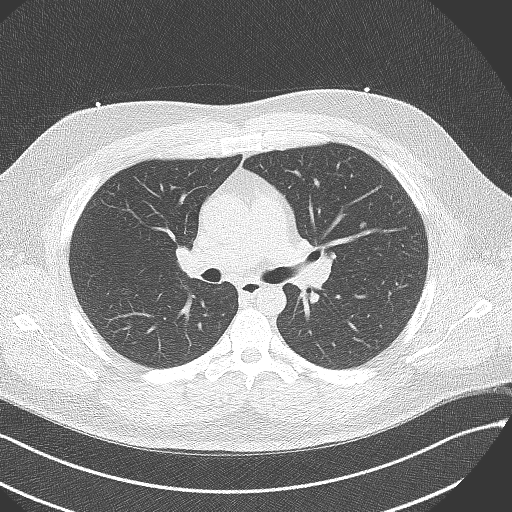

[Series 4: lung st 36 % · axial · 0.77mm/px · z∈[-254,-162]mm · 5 of 47 slices shown]
[im 8/47  lung]
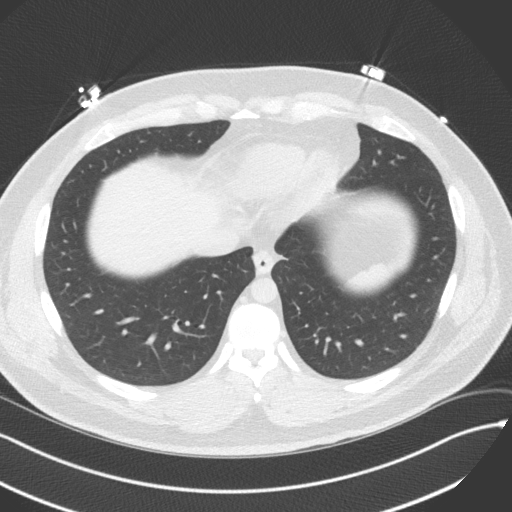
[im 16/47  lung]
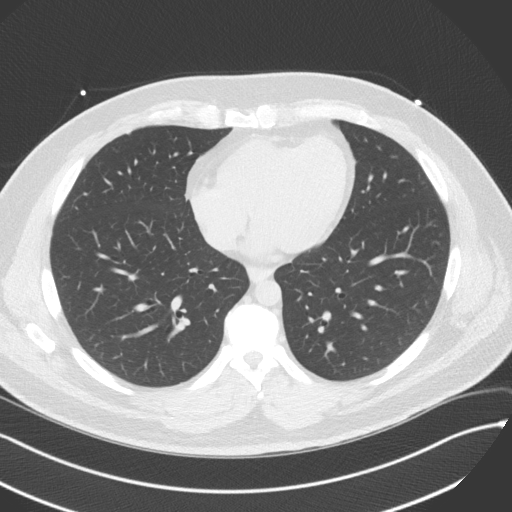
[im 24/47  lung]
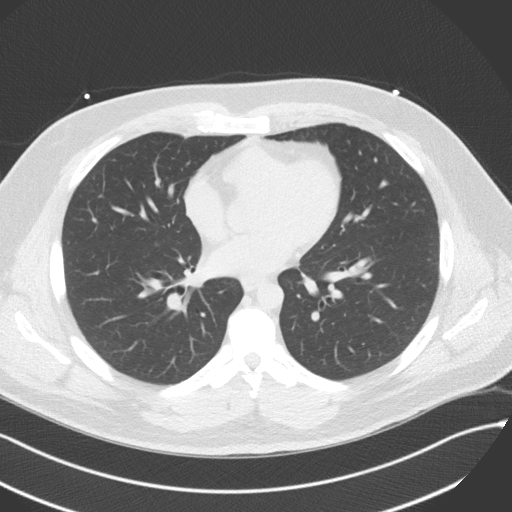
[im 31/47  lung]
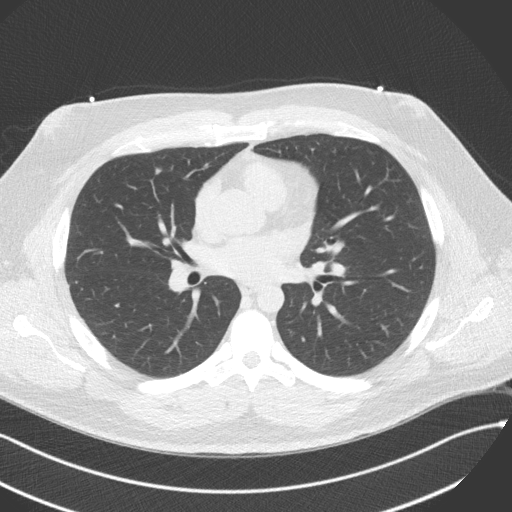
[im 39/47  lung]
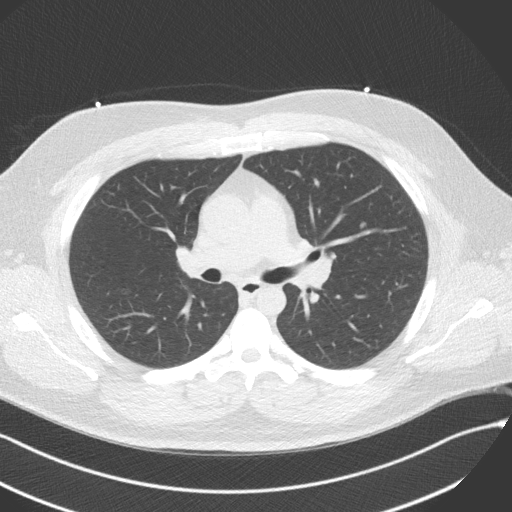

[14 of 20 positions shown; findings below may reference images not displayed]

FINDINGS: Tiny pulmonary nodules measuring 4 mm or less in size in the right
lung. Within the visualized portions of the thorax there are no
other larger more suspicious appearing pulmonary nodules or masses,
there is no acute consolidative airspace disease, no pleural
effusions, no pneumothorax and no lymphadenopathy. Visualized
portions of the upper abdomen are unremarkable. There are no
aggressive appearing lytic or blastic lesions noted in the
visualized portions of the skeleton.
IMPRESSION: Tiny pulmonary nodules measuring 5 mm or less in size, nonspecific,
but statistically likely benign. No follow-up needed if patient is
low-risk (and has no known or suspected primary neoplasm).
Non-contrast chest CT can be considered in 12 months if patient is
high-risk. This recommendation follows the consensus statement:
Guidelines for Management of Incidental Pulmonary Nodules Detected
FINDINGS: Non-cardiac: See separate report from [REDACTED].

Ascending Aorta: Normal size, measuring 37 mm at the mid ascending
aorta, measured double oblique at PA bifurcation. No significant
calcification.

Pericardium: Normal

Coronary arteries: Arise from normal coronary cusps.
IMPRESSION: Coronary calcium score of 0. This was 0th percentile for age and sex
matched control.

Melanye Ballardo

*** End of Addendum ***
EXAM:
OVER-READ INTERPRETATION  CT CHEST

The following report is an over-read performed by radiologist Dr.
Mehaad Tork [REDACTED] on 09/04/2020. This
over-read does not include interpretation of cardiac or coronary
anatomy or pathology. The coronary calcium score interpretation by
the cardiologist is attached.
FINDINGS: Tiny pulmonary nodules measuring 4 mm or less in size in the right
lung. Within the visualized portions of the thorax there are no
other larger more suspicious appearing pulmonary nodules or masses,
there is no acute consolidative airspace disease, no pleural
effusions, no pneumothorax and no lymphadenopathy. Visualized
portions of the upper abdomen are unremarkable. There are no
aggressive appearing lytic or blastic lesions noted in the
visualized portions of the skeleton.
IMPRESSION: Tiny pulmonary nodules measuring 5 mm or less in size, nonspecific,
but statistically likely benign. No follow-up needed if patient is
low-risk (and has no known or suspected primary neoplasm).
Non-contrast chest CT can be considered in 12 months if patient is
high-risk. This recommendation follows the consensus statement:
Guidelines for Management of Incidental Pulmonary Nodules Detected

## 2021-04-22 DIAGNOSIS — L989 Disorder of the skin and subcutaneous tissue, unspecified: Secondary | ICD-10-CM | POA: Diagnosis not present

## 2021-05-20 DIAGNOSIS — S99221A Salter-Harris Type II physeal fracture of phalanx of right toe, initial encounter for closed fracture: Secondary | ICD-10-CM | POA: Diagnosis not present

## 2021-06-01 DIAGNOSIS — S0101XA Laceration without foreign body of scalp, initial encounter: Secondary | ICD-10-CM | POA: Diagnosis not present

## 2021-06-02 DIAGNOSIS — Q676 Pectus excavatum: Secondary | ICD-10-CM | POA: Diagnosis not present

## 2021-06-02 DIAGNOSIS — R079 Chest pain, unspecified: Secondary | ICD-10-CM | POA: Diagnosis not present

## 2021-08-03 ENCOUNTER — Ambulatory Visit (INDEPENDENT_AMBULATORY_CARE_PROVIDER_SITE_OTHER): Payer: BC Managed Care – PPO | Admitting: Family Medicine

## 2021-08-03 ENCOUNTER — Other Ambulatory Visit: Payer: Self-pay

## 2021-08-03 ENCOUNTER — Encounter: Payer: Self-pay | Admitting: Family Medicine

## 2021-08-03 VITALS — BP 118/73 | HR 84 | Temp 97.8°F | Resp 16 | Ht 69.0 in | Wt 241.0 lb

## 2021-08-03 DIAGNOSIS — H93A1 Pulsatile tinnitus, right ear: Secondary | ICD-10-CM | POA: Diagnosis not present
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
## 2021-09-08 ENCOUNTER — Other Ambulatory Visit: Payer: Self-pay

## 2021-09-08 ENCOUNTER — Ambulatory Visit (INDEPENDENT_AMBULATORY_CARE_PROVIDER_SITE_OTHER): Payer: BC Managed Care – PPO | Admitting: Family Medicine

## 2021-09-08 ENCOUNTER — Encounter: Payer: Self-pay | Admitting: Family Medicine

## 2021-09-08 VITALS — BP 110/73 | HR 76 | Temp 97.8°F | Resp 16 | Ht 69.0 in | Wt 243.4 lb

## 2021-09-08 DIAGNOSIS — Z0000 Encounter for general adult medical examination without abnormal findings: Secondary | ICD-10-CM

## 2021-09-08 DIAGNOSIS — H9313 Tinnitus, bilateral: Secondary | ICD-10-CM | POA: Diagnosis not present

## 2021-09-08 LAB — CBC WITH DIFFERENTIAL/PLATELET
Basophils Absolute: 0.1 10*3/uL (ref 0.0–0.1)
Basophils Relative: 1 % (ref 0.0–3.0)
Eosinophils Absolute: 0.3 10*3/uL (ref 0.0–0.7)
Eosinophils Relative: 4.2 % (ref 0.0–5.0)
HCT: 45.4 % (ref 39.0–52.0)
Hemoglobin: 15.9 g/dL (ref 13.0–17.0)
Lymphocytes Relative: 32.6 % (ref 12.0–46.0)
Lymphs Abs: 2.2 10*3/uL (ref 0.7–4.0)
MCHC: 35 g/dL (ref 30.0–36.0)
MCV: 93.3 fl (ref 78.0–100.0)
Monocytes Absolute: 0.6 10*3/uL (ref 0.1–1.0)
Monocytes Relative: 8.8 % (ref 3.0–12.0)
Neutro Abs: 3.6 10*3/uL (ref 1.4–7.7)
Neutrophils Relative %: 53.4 % (ref 43.0–77.0)
Platelets: 201 10*3/uL (ref 150.0–400.0)
RBC: 4.87 Mil/uL (ref 4.22–5.81)
RDW: 13.2 % (ref 11.5–15.5)
WBC: 6.8 10*3/uL (ref 4.0–10.5)

## 2021-09-08 LAB — COMPREHENSIVE METABOLIC PANEL
ALT: 24 U/L (ref 0–53)
AST: 17 U/L (ref 0–37)
Albumin: 4.3 g/dL (ref 3.5–5.2)
Alkaline Phosphatase: 98 U/L (ref 39–117)
BUN: 10 mg/dL (ref 6–23)
CO2: 25 mEq/L (ref 19–32)
Calcium: 9.3 mg/dL (ref 8.4–10.5)
Chloride: 106 mEq/L (ref 96–112)
Creatinine, Ser: 0.87 mg/dL (ref 0.40–1.50)
GFR: 106.17 mL/min (ref >60.00)
Glucose, Bld: 87 mg/dL (ref 70–99)
Potassium: 3.9 mEq/L (ref 3.5–5.1)
Sodium: 139 mEq/L (ref 135–145)
Total Bilirubin: 1.5 mg/dL — ABNORMAL HIGH (ref 0.2–1.2)
Total Protein: 6.8 g/dL (ref 6.0–8.3)

## 2021-09-08 LAB — LIPID PANEL
Cholesterol: 155 mg/dL (ref 0–200)
HDL: 39.1 mg/dL (ref >39.00)
LDL Cholesterol: 87 mg/dL (ref 0–99)
NonHDL: 116
Total CHOL/HDL Ratio: 4
Triglycerides: 145 mg/dL (ref 0.0–149.0)
VLDL: 29 mg/dL (ref 0.0–40.0)

## 2021-09-08 LAB — TSH: TSH: 1.74 u[IU]/mL (ref 0.35–5.50)

## 2021-09-08 NOTE — Progress Notes (Signed)
Office Note 09/08/2021  CC:  Chief Complaint  Patient presents with   Annual Exam    Pt is fasting    HPI:  Daniel Cardenas is a 45 y.o. White male who is here for annual health maintenance exam. I last saw him about a month ago. A/P as of that visit: "Pulsatile tinnitus R ear, worsening. Discussed options of ENT referral with MR brain in the interim vs ENT referral and hold off on imaging at this time. Pt preferred ENT referral and hold off on imaging at this time.  Referred to Dr. Pollyann Kennedy today."  INTERIM HX: Doing well other than ongoing probs with pulsatile tinnitus. Has appt set to see ENT next week.  Trying to get back into better diet/exercise habits.   Past Medical History:  Diagnosis Date   COVID-19 virus infection 12/2019   Depression    Elevated alkaline phosphatase level    Abd u/s normal 04/01/17   Epigastric pain    EGD 04/2020 normal except a few gastric polyps-->nonulcer dyspepsia   External hemorrhoid, bleeding    GAD (generalized anxiety disorder)    citalopram helpful   GERD (gastroesophageal reflux disease)    daily prilosec OTC helps   H/O clavicle fracture 12/29/2018   ORIF 12/29/2018, revision 01/05/2019 b/c patient fell on clavicle AGAIN.   Hay fever    History of depression    Obesity, Class II, BMI 35-39.9    OSA on CPAP 08/2014   Followed by pulm    Past Surgical History:  Procedure Laterality Date   coronary calcium score  09/04/2020   Zero   ESOPHAGOGASTRODUODENOSCOPY  04/2020   NORMAL except a few gastric polyps.  Gastric bx: mild reactive gastropathy, H pylori NEG   ETT  08/2020   no ischemia   ORIF CLAVICULAR FRACTURE Right 12/28/2018   with revision 01/05/2019.  Procedure: OPEN REDUCTION INTERNAL FIXATION (ORIF) CLAVICULAR FRACTURE;  Surgeon: Kathryne Hitch, MD;  Location: [facility];  Service: Orthopedics;  Laterality: Right;   ORIF CLAVICULAR FRACTURE Right 01/05/2019   Procedure: OPEN REDUCTION INTERNAL FIXATION (ORIF)   RIGHT CLAVICLE FRACTURE;  Surgeon: Kathryne Hitch, MD;  Location: [facility];  Service: Orthopedics;  Laterality: Right;   WISDOM TOOTH EXTRACTION  1998    Family History  Problem Relation Age of Onset   Arthritis Mother    Arthritis Father    Hyperlipidemia Father    Hypertension Father    Diabetes Father    Dementia Father        Frontotemporal degeneration   Stroke Maternal Grandfather    Hypertension Paternal Grandfather    Mental illness Paternal Grandfather        Multi-infarct dementia   Diabetes Paternal Grandfather    Heart disease Paternal Grandfather    Colon cancer Neg Hx    Esophageal cancer Neg Hx    Rectal cancer Neg Hx    Stomach cancer Neg Hx     Social History   Socioeconomic History   Marital status: Married    Spouse name: [name]   Number of children: 3   Years of education: MD   Highest education level: Not on file  Occupational History   Occupation: ANESTHESIOLOGIST  Tobacco Use   Smoking status: Never   Smokeless tobacco: Never  Vaping Use   Vaping Use: Never used  Substance and Sexual Activity   Alcohol use: Yes    Comment: occasional-- 1 beer every 3 days or so   Drug use: No  Sexual activity: Yes    Partners: Female  Other Topics Concern   Not on file  Social History Narrative   Married, 4 children.   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  Residency--[employer]).   No tobacco.  Occ alcohol.  No drug use/abuse.   No exercise.   Social Determinants of Health   Financial Resource Strain: Not on file  Food Insecurity: Not on file  Transportation Needs: Not on file  Physical Activity: Not on file  Stress: Not on file  Social Connections: Not on file  Intimate Partner Violence: Not on file    Outpatient Medications Prior to Visit  Medication Sig Dispense Refill   cetirizine (ZYRTEC) 10 MG tablet Take 10 mg by mouth daily.     esomeprazole (NEXIUM) 40 MG capsule Take 1 capsule  (40 mg total) by mouth 2 (two) times daily before a meal. 60 capsule 1   No facility-administered medications prior to visit.    No Known Allergies  ROS Review of Systems  Constitutional:  Negative for appetite change, chills, fatigue and fever.  HENT:  Negative for congestion, dental problem, ear pain and sore throat.   Eyes:  Negative for discharge, redness and visual disturbance.  Respiratory:  Negative for cough, chest tightness, shortness of breath and wheezing.   Cardiovascular:  Negative for chest pain, palpitations and leg swelling.  Gastrointestinal:  Negative for abdominal pain, blood in stool, diarrhea, nausea and vomiting.  Genitourinary:  Negative for difficulty urinating, dysuria, flank pain, frequency, hematuria and urgency.  Musculoskeletal:  Negative for arthralgias, back pain, joint swelling, myalgias and neck stiffness.  Skin:  Negative for pallor and rash.  Neurological:  Negative for dizziness, speech difficulty, weakness and headaches.  Hematological:  Negative for adenopathy. Does not bruise/bleed easily.  Psychiatric/Behavioral:  Negative for confusion and sleep disturbance. The patient is not nervous/anxious.    PE; Vitals with BMI 09/08/2021 08/03/2021 08/14/2020  Height 5\' 9"  5\' 9"  5\' 9"   Weight 243 lbs 6 oz 241 lbs 240 lbs  BMI 35.93 35.57 35.43  Systolic 110 118  Diastolic 73 73 98  Pulse 76 84 81   Gen: Alert, well appearing.  Patient is oriented to person, place, time, and situation. AFFECT: pleasant, lucid thought and speech. ENT: Ears: EACs clear, normal epithelium.  TMs with good light reflex and landmarks bilaterally.  Eyes: no injection, icteris, swelling, or exudate.  EOMI, PERRLA. Nose: no drainage or turbinate edema/swelling.  No injection or focal lesion.  Mouth: lips without lesion/swelling.  Oral mucosa pink and moist.  Dentition intact and without obvious caries or gingival swelling.  Oropharynx without erythema, exudate, or swelling.   Neck: supple/nontender.  No LAD, mass, or TM.  Carotid pulses 2+ bilaterally, without bruits. CV: RRR, no m/r/g.   LUNGS: CTA bilat, nonlabored resps, good aeration in all lung fields. ABD: soft, NT, ND, BS normal.  No hepatospenomegaly or mass.  No bruits. EXT: no clubbing, cyanosis, or edema.  Musculoskeletal: no joint swelling, erythema, warmth, or tenderness.  ROM of all joints intact. Skin - no sores or suspicious lesions or rashes or color changes  Pertinent labs:  Lab Results  Component Value Date   TSH 1.79 02/15/2020   Lab Results  Component Value Date   WBC 8.5 07/02/2020   HGB 16.0 07/02/2020   HCT 45.2 07/02/2020   MCV 93.2 07/02/2020   PLT 213.0 07/02/2020   Lab Results  Component Value Date   CREATININE 0.89  07/02/2020   BUN 11 07/02/2020   NA 139 07/02/2020   K 4.1 07/02/2020   CL 105 07/02/2020   CO2 28 07/02/2020   Lab Results  Component Value Date   ALT 30 07/02/2020   AST 24 07/02/2020   ALKPHOS 99 07/02/2020   BILITOT 1.6 (H) 07/02/2020   Lab Results  Component Value Date   CHOL 166 02/15/2020   Lab Results  Component Value Date   HDL 39 (L) 02/15/2020   Lab Results  Component Value Date   LDLCALC 106 (H) 02/15/2020   Lab Results  Component Value Date   TRIG 109 02/15/2020   Lab Results  Component Value Date   CHOLHDL 4.3 02/15/2020    ASSESSMENT AND PLAN:   Health maintenance exam: Reviewed age and gender appropriate health maintenance issues (prudent diet, regular exercise, health risks of tobacco and excessive alcohol, use of seatbelts, fire alarms in home, use of sunscreen).  Also reviewed age and gender appropriate health screening as well as vaccine recommendations. Vaccines: Flu->pt declined.  Otherwise UTD. Labs: fasting HP today. Prostate ca screening: average risk patient= as per latest guidelines, start screening at 70 yrs of age. Colon ca screening: average risk patient= as per latest guidelines, start screening at 65  yrs of age.  An After Visit Summary was printed and given to the patient.  FOLLOW UP:  Return in about 1 year (around 09/08/2022) for annual CPE (fasting).  Signed:  Santiago Bumpers, MD           09/08/2021

## 2021-09-14 DIAGNOSIS — H93A1 Pulsatile tinnitus, right ear: Secondary | ICD-10-CM | POA: Diagnosis not present

## 2021-09-28 DIAGNOSIS — H93A1 Pulsatile tinnitus, right ear: Secondary | ICD-10-CM | POA: Diagnosis not present

## 2022-03-24 ENCOUNTER — Encounter: Payer: Self-pay | Admitting: Family Medicine

## 2022-08-11 DIAGNOSIS — R42 Dizziness and giddiness: Secondary | ICD-10-CM | POA: Diagnosis not present

## 2022-08-11 DIAGNOSIS — H93293 Other abnormal auditory perceptions, bilateral: Secondary | ICD-10-CM | POA: Diagnosis not present

## 2022-11-15 ENCOUNTER — Encounter: Payer: Self-pay | Admitting: Family Medicine

## 2022-11-15 ENCOUNTER — Ambulatory Visit (INDEPENDENT_AMBULATORY_CARE_PROVIDER_SITE_OTHER): Payer: BC Managed Care – PPO | Admitting: Family Medicine

## 2022-11-15 VITALS — BP 123/84 | HR 74 | Temp 98.0°F | Ht 69.0 in | Wt 241.6 lb

## 2022-11-15 DIAGNOSIS — Z0000 Encounter for general adult medical examination without abnormal findings: Secondary | ICD-10-CM | POA: Diagnosis not present

## 2022-11-15 DIAGNOSIS — M5442 Lumbago with sciatica, left side: Secondary | ICD-10-CM

## 2022-11-15 DIAGNOSIS — G8929 Other chronic pain: Secondary | ICD-10-CM | POA: Diagnosis not present

## 2022-11-15 DIAGNOSIS — R29898 Other symptoms and signs involving the musculoskeletal system: Secondary | ICD-10-CM

## 2022-11-15 DIAGNOSIS — M5441 Lumbago with sciatica, right side: Secondary | ICD-10-CM | POA: Diagnosis not present

## 2022-11-15 NOTE — Progress Notes (Signed)
OFFICE VISIT  11/15/2022  CC:  Chief Complaint  Patient presents with   Back Pain    On and off for years; constant, pain is not severe. Pain has worsened in the last 3 months   Numbness    Back of his legs down to his feet depending on how he sits or how long he sits.   Patient is a 46 y.o. male who presents for back pain as well as annual health maintenance exam.  HPI: Long history of low back pain, on and off for several years, getting worse gradually. Worse with prolonged sitting or with prolonged standing.  In supine position seems to be relieved somewhat. Lately has been noticing intermittent tingling and numbness down the backs of both legs to the feet.  Additionally, recently got up after sitting crosslegged on the floor for a little while and noticed some left foot drop briefly.  No saddle anesthesia. No loss of bowel or bladder control. No history of traumatic injury or significant acute sprain or strain.  Past Medical History:  Diagnosis Date   COVID-19 virus infection 12/2019   Depression    Elevated alkaline phosphatase level    Abd u/s normal 04/01/17   Epigastric pain    EGD 04/2020 normal except a few gastric polyps-->nonulcer dyspepsia   External hemorrhoid, bleeding    GAD (generalized anxiety disorder)    citalopram helpful   GERD (gastroesophageal reflux disease)    daily prilosec OTC helps   H/O clavicle fracture 12/29/2018   ORIF 12/29/2018, revision 01/05/2019 b/c patient fell on clavicle AGAIN.   Hay fever    History of depression    Obesity, Class II, BMI 35-39.9    OSA on CPAP 08/2014   Followed by pulm    Past Surgical History:  Procedure Laterality Date   coronary calcium score  09/04/2020   Zero   ESOPHAGOGASTRODUODENOSCOPY  04/2020   2021 NORMAL except a few gastric polyps.  Gastric bx: mild reactive gastropathy, H pylori NEG.  02/2022 SAME as 2021   ETT  08/2020   no ischemia   ORIF CLAVICULAR FRACTURE Right 12/28/2018   with revision  01/05/2019.  Procedure: OPEN REDUCTION INTERNAL FIXATION (ORIF) CLAVICULAR FRACTURE;  Surgeon: Kathryne Hitch, MD;  Location: [facility];  Service: Orthopedics;  Laterality: Right;   ORIF CLAVICULAR FRACTURE Right 01/05/2019   Procedure: OPEN REDUCTION INTERNAL FIXATION (ORIF)  RIGHT CLAVICLE FRACTURE;  Surgeon: Kathryne Hitch, MD;  Location: [facility];  Service: Orthopedics;  Laterality: Right;   WISDOM TOOTH EXTRACTION  1998    Outpatient Medications Prior to Visit  Medication Sig Dispense Refill   cetirizine (ZYRTEC) 10 MG tablet Take 10 mg by mouth daily.     esomeprazole (NEXIUM) 40 MG capsule Take 1 capsule (40 mg total) by mouth 2 (two) times daily before a meal. 60 capsule 1   No facility-administered medications prior to visit.    No Known Allergies  Review of Systems  Constitutional:  Negative for appetite change, chills, fatigue and fever.  HENT:  Negative for congestion, dental problem, ear pain and sore throat.   Eyes:  Negative for discharge, redness and visual disturbance.  Respiratory:  Negative for cough, chest tightness, shortness of breath and wheezing.   Cardiovascular:  Negative for chest pain, palpitations and leg swelling.  Gastrointestinal:  Negative for abdominal pain, blood in stool, diarrhea, nausea and vomiting.  Genitourinary:  Negative for difficulty urinating, dysuria, flank pain, frequency, hematuria and urgency.  Musculoskeletal:  Positive for back pain. Negative for arthralgias, joint swelling, myalgias and neck stiffness.  Skin:  Negative for pallor and rash.  Neurological:  Negative for dizziness, speech difficulty, weakness and headaches.  Endo/Heme/Allergies:  Negative for adenopathy. Does not bruise/bleed easily.  Psychiatric/Behavioral:  Negative for confusion and sleep disturbance. The patient is nervous/anxious.    Review of Systems  Constitutional:  Negative for appetite change, chills, fatigue and fever.  HENT:  Negative for  congestion, dental problem, ear pain and sore throat.   Eyes:  Negative for discharge, redness and visual disturbance.  Respiratory:  Negative for cough, chest tightness, shortness of breath and wheezing.   Cardiovascular:  Negative for chest pain, palpitations and leg swelling.  Gastrointestinal:  Negative for abdominal pain, blood in stool, diarrhea, nausea and vomiting.  Genitourinary:  Negative for difficulty urinating, dysuria, flank pain, frequency, hematuria and urgency.  Musculoskeletal:  Positive for back pain. Negative for arthralgias, joint swelling, myalgias and neck stiffness.  Skin:  Negative for pallor and rash.  Neurological:  Negative for dizziness, speech difficulty, weakness and headaches.  Hematological:  Negative for adenopathy. Does not bruise/bleed easily.  Psychiatric/Behavioral:  Negative for confusion and sleep disturbance. The patient is nervous/anxious.      PE:    11/15/2022    2:24 PM 09/08/2021   10:37 AM 08/03/2021    2:26 PM  Vitals with BMI  Height 5\' 9"  5\' 9"  5\' 9"   Weight 241 lbs 10 oz 243 lbs 6 oz 241 lbs  BMI 35.66 35.93 35.57  Systolic 123 110  Diastolic 84 73 73  Pulse 74 76 84   Physical Exam  Gen: Alert, well appearing.  Patient is oriented to person, place, time, and situation. AFFECT: pleasant, lucid thought and speech. ENT: Ears: EACs clear, normal epithelium.  TMs with good light reflex and landmarks bilaterally.  Eyes: no injection, icteris, swelling, or exudate.  EOMI, PERRLA. Nose: no drainage or turbinate edema/swelling.  No injection or focal lesion.  Mouth: lips without lesion/swelling.  Oral mucosa pink and moist.  Dentition intact and without obvious caries or gingival swelling.  Oropharynx without erythema, exudate, or swelling.  Neck: supple/nontender.  No LAD, mass, or TM.  Carotid pulses 2+ bilaterally, without bruits. CV: RRR, no m/r/g.   LUNGS: CTA bilat, nonlabored resps, good aeration in all lung fields. ABD: soft,  NT, ND, BS normal.  No hepatospenomegaly or mass.  No bruits. EXT: no clubbing, cyanosis, or edema.  Musculoskeletal: no joint swelling, erythema, warmth, or tenderness.  ROM of all joints intact. Skin - no sores or suspicious lesions or rashes or color changes Neuro: CN 2-12 intact bilaterally, strength 5/5 in proximal and distal upper extremitiesNo sensory deficits.  No tremor.    No ataxia.   MOTOR EXAMINATION:   Lower Extremity: L2,3 (hip flexors) Left: 5/5. Right: 5/5 L3,4 (knee extensors) Left: 5/5. Right: 5/5 L2,3,4(Hip Adduction) Left: 5/5. Right: 5/5 L4,5 (ankle dorsiflexion/TA) Left: 4/5. Right: 5/5 L5 (Hip abduction) Left: 5/5. Right: 5/5 L5 (EHL) Left: 5/5. Right: 5/5 S1 (ankle plantarflexion): Left: 5/5. Right: 5/5 L5-S2 (Knee flexors) Left 5/5.  Right 5/5  REFLEXES:  L4 Patellar tendon: Trace bilaterally S1 Achilles tendon: Trace bilaterally    LABS:  Last CBC Lab Results  Component Value Date   WBC 6.8 09/08/2021   HGB 15.9 09/08/2021   HCT 45.4 09/08/2021   MCV 93.3 09/08/2021   MCH 33.4 (H) 02/15/2020   RDW 13.2 09/08/2021   PLT 201.0 09/08/2021  Last metabolic panel Lab Results  Component Value Date   GLUCOSE 87 09/08/2021   NA 139 09/08/2021   K 3.9 09/08/2021   CL 106 09/08/2021   CO2 25 09/08/2021   BUN 10 09/08/2021   CREATININE 0.87 09/08/2021   CALCIUM 9.3 09/08/2021   PROT 6.8 09/08/2021   ALBUMIN 4.3 09/08/2021   BILITOT 1.5 (H) 09/08/2021   ALKPHOS 98 09/08/2021   AST 17 09/08/2021   ALT 24 09/08/2021   Last lipids Lab Results  Component Value Date   CHOL 155 09/08/2021   HDL 39.10 09/08/2021   LDLCALC 87 09/08/2021   TRIG 145.0 09/08/2021   CHOLHDL 4 09/08/2021   Last thyroid functions Lab Results  Component Value Date   TSH 1.74 09/08/2021   IMPRESSION AND PLAN:  #1 progressive bilateral lower back pain with bilateral sciatica symptoms.  Additionally, some subtle weakness of dorsiflexion of the left ankle. Obtain  lumbar spine radiographs. Additionally, given bilateral symptoms and left leg weakness will obtain number spine MRI without contrast. Refer to physical therapy.  #2 Health maintenance exam: Reviewed age and gender appropriate health maintenance issues (prudent diet, regular exercise, health risks of tobacco and excessive alcohol, use of seatbelts, fire alarms in home, use of sunscreen).  Also reviewed age and gender appropriate health screening as well as vaccine recommendations. Vaccines: Up-to-date Labs: Fasting health panel labs today Prostate cancer screening: Average risk patient= as per latest guidelines, start screening at 71 yrs of age. Colon ca screening: start screening in 1 yr, age 20.  An After Visit Summary was printed and given to the patient.  FOLLOW UP: No follow-ups on file.  Signed:  Santiago Bumpers, MD           11/15/2022

## 2022-11-16 LAB — CBC WITH DIFFERENTIAL/PLATELET
Basophils Absolute: 0 10*3/uL (ref 0.0–0.1)
Basophils Relative: 0.5 % (ref 0.0–3.0)
Eosinophils Absolute: 0.4 10*3/uL (ref 0.0–0.7)
Eosinophils Relative: 4.1 % (ref 0.0–5.0)
HCT: 47.4 % (ref 39.0–52.0)
Hemoglobin: 16.5 g/dL (ref 13.0–17.0)
Lymphocytes Relative: 32.6 % (ref 12.0–46.0)
Lymphs Abs: 2.8 10*3/uL (ref 0.7–4.0)
MCHC: 34.9 g/dL (ref 30.0–36.0)
MCV: 94.3 fl (ref 78.0–100.0)
Monocytes Absolute: 0.8 10*3/uL (ref 0.1–1.0)
Monocytes Relative: 8.7 % (ref 3.0–12.0)
Neutro Abs: 4.7 10*3/uL (ref 1.4–7.7)
Neutrophils Relative %: 54.1 % (ref 43.0–77.0)
Platelets: 240 10*3/uL (ref 150.0–400.0)
RBC: 5.03 Mil/uL (ref 4.22–5.81)
RDW: 13.1 % (ref 11.5–15.5)
WBC: 8.7 10*3/uL (ref 4.0–10.5)

## 2022-11-16 LAB — COMPREHENSIVE METABOLIC PANEL
ALT: 32 U/L (ref 0–53)
AST: 23 U/L (ref 0–37)
Albumin: 4.6 g/dL (ref 3.5–5.2)
Alkaline Phosphatase: 104 U/L (ref 39–117)
BUN: 11 mg/dL (ref 6–23)
CO2: 31 mEq/L (ref 19–32)
Calcium: 9.7 mg/dL (ref 8.4–10.5)
Chloride: 103 mEq/L (ref 96–112)
Creatinine, Ser: 1 mg/dL (ref 0.40–1.50)
GFR: 91.84 mL/min (ref >60.00)
Glucose, Bld: 77 mg/dL (ref 70–99)
Potassium: 4.4 mEq/L (ref 3.5–5.1)
Sodium: 140 mEq/L (ref 135–145)
Total Bilirubin: 1.9 mg/dL — ABNORMAL HIGH (ref 0.2–1.2)
Total Protein: 7.1 g/dL (ref 6.0–8.3)

## 2022-11-16 LAB — LIPID PANEL
Cholesterol: 162 mg/dL (ref 0–200)
HDL: 42.3 mg/dL (ref >39.00)
LDL Cholesterol: 88 mg/dL (ref 0–99)
NonHDL: 119.47
Total CHOL/HDL Ratio: 4
Triglycerides: 157 mg/dL — ABNORMAL HIGH (ref 0.0–149.0)
VLDL: 31.4 mg/dL (ref 0.0–40.0)

## 2022-11-16 LAB — TSH: TSH: 1.73 u[IU]/mL (ref 0.35–5.50)

## 2022-11-17 ENCOUNTER — Encounter: Payer: Self-pay | Admitting: Family Medicine

## 2022-11-24 ENCOUNTER — Ambulatory Visit (HOSPITAL_BASED_OUTPATIENT_CLINIC_OR_DEPARTMENT_OTHER)
Admission: RE | Admit: 2022-11-24 | Discharge: 2022-11-24 | Disposition: A | Discharge status: Home / Self Care | Payer: BC Managed Care – PPO | Source: Ambulatory Visit | Attending: Family Medicine | Admitting: Family Medicine

## 2022-11-24 DIAGNOSIS — M5137 Other intervertebral disc degeneration, lumbosacral region: Secondary | ICD-10-CM | POA: Diagnosis not present

## 2022-11-24 DIAGNOSIS — M5442 Lumbago with sciatica, left side: Secondary | ICD-10-CM | POA: Diagnosis not present

## 2022-11-24 DIAGNOSIS — M5441 Lumbago with sciatica, right side: Secondary | ICD-10-CM | POA: Insufficient documentation

## 2022-11-24 DIAGNOSIS — G8929 Other chronic pain: Secondary | ICD-10-CM | POA: Insufficient documentation

## 2022-11-24 DIAGNOSIS — M545 Low back pain, unspecified: Secondary | ICD-10-CM | POA: Diagnosis not present

## 2022-11-24 NOTE — Addendum Note (Signed)
Addended by: Filomena Jungling on: 11/24/2022 11:31 AM   Modules accepted: Orders

## 2022-12-04 ENCOUNTER — Ambulatory Visit (INDEPENDENT_AMBULATORY_CARE_PROVIDER_SITE_OTHER): Payer: BC Managed Care – PPO

## 2022-12-04 DIAGNOSIS — R29898 Other symptoms and signs involving the musculoskeletal system: Secondary | ICD-10-CM | POA: Diagnosis not present

## 2022-12-04 DIAGNOSIS — G8929 Other chronic pain: Secondary | ICD-10-CM | POA: Diagnosis not present

## 2022-12-04 DIAGNOSIS — M5441 Lumbago with sciatica, right side: Secondary | ICD-10-CM

## 2022-12-04 DIAGNOSIS — M5126 Other intervertebral disc displacement, lumbar region: Secondary | ICD-10-CM | POA: Diagnosis not present

## 2022-12-04 DIAGNOSIS — M5442 Lumbago with sciatica, left side: Secondary | ICD-10-CM | POA: Diagnosis not present

## 2022-12-09 ENCOUNTER — Encounter: Payer: Self-pay | Admitting: Family Medicine

## 2022-12-10 DIAGNOSIS — M5451 Vertebrogenic low back pain: Secondary | ICD-10-CM | POA: Diagnosis not present

## 2022-12-21 ENCOUNTER — Encounter: Payer: Self-pay | Admitting: Family Medicine

## 2022-12-21 MED ORDER — PREDNISONE 10 MG PO TABS: ORAL_TABLET | ORAL | 2 refills | Status: DC

## 2022-12-21 NOTE — Telephone Encounter (Signed)
Done

## 2022-12-21 NOTE — Telephone Encounter (Signed)
OK, let me know if you want me to send in some prednisone.

## 2022-12-22 NOTE — Telephone Encounter (Signed)
MyChart message read.

## 2023-08-26 ENCOUNTER — Encounter: Payer: Self-pay | Admitting: Family Medicine

## 2023-08-26 ENCOUNTER — Ambulatory Visit (INDEPENDENT_AMBULATORY_CARE_PROVIDER_SITE_OTHER): Payer: BC Managed Care – PPO | Admitting: Family Medicine

## 2023-08-26 VITALS — BP 123/85 | HR 78 | Ht 69.0 in | Wt 248.4 lb

## 2023-08-26 DIAGNOSIS — R1011 Right upper quadrant pain: Secondary | ICD-10-CM | POA: Diagnosis not present

## 2023-08-26 DIAGNOSIS — E7800 Pure hypercholesterolemia, unspecified: Secondary | ICD-10-CM

## 2023-08-26 DIAGNOSIS — R748 Abnormal levels of other serum enzymes: Secondary | ICD-10-CM

## 2023-08-26 LAB — COMPREHENSIVE METABOLIC PANEL
ALT: 34 U/L (ref 0–53)
AST: 22 U/L (ref 0–37)
Albumin: 4.2 g/dL (ref 3.5–5.2)
Alkaline Phosphatase: 101 U/L (ref 39–117)
BUN: 17 mg/dL (ref 6–23)
CO2: 28 mEq/L (ref 19–32)
Calcium: 9.3 mg/dL (ref 8.4–10.5)
Chloride: 106 mEq/L (ref 96–112)
Creatinine, Ser: 0.91 mg/dL (ref 0.40–1.50)
GFR: 102.29 mL/min (ref >60.00)
Glucose, Bld: 87 mg/dL (ref 70–99)
Potassium: 4.2 mEq/L (ref 3.5–5.1)
Sodium: 141 mEq/L (ref 135–145)
Total Bilirubin: 1.4 mg/dL — ABNORMAL HIGH (ref 0.2–1.2)
Total Protein: 6.7 g/dL (ref 6.0–8.3)

## 2023-08-26 LAB — LIPID PANEL
Cholesterol: 149 mg/dL (ref 0–200)
HDL: 36.5 mg/dL — ABNORMAL LOW (ref >39.00)
LDL Cholesterol: 89 mg/dL (ref 0–99)
NonHDL: 112.68
Total CHOL/HDL Ratio: 4
Triglycerides: 120 mg/dL (ref 0.0–149.0)
VLDL: 24 mg/dL (ref 0.0–40.0)

## 2023-08-26 LAB — CBC WITH DIFFERENTIAL/PLATELET
Basophils Absolute: 0.1 10*3/uL (ref 0.0–0.1)
Basophils Relative: 1.1 % (ref 0.0–3.0)
Eosinophils Absolute: 0.3 10*3/uL (ref 0.0–0.7)
Eosinophils Relative: 4.9 % (ref 0.0–5.0)
HCT: 48 % (ref 39.0–52.0)
Hemoglobin: 16.2 g/dL (ref 13.0–17.0)
Lymphocytes Relative: 32.8 % (ref 12.0–46.0)
Lymphs Abs: 2.3 10*3/uL (ref 0.7–4.0)
MCHC: 33.8 g/dL (ref 30.0–36.0)
MCV: 95.9 fl (ref 78.0–100.0)
Monocytes Absolute: 0.7 10*3/uL (ref 0.1–1.0)
Monocytes Relative: 10.2 % (ref 3.0–12.0)
Neutro Abs: 3.6 10*3/uL (ref 1.4–7.7)
Neutrophils Relative %: 51 % (ref 43.0–77.0)
Platelets: 222 10*3/uL (ref 150.0–400.0)
RBC: 5 Mil/uL (ref 4.22–5.81)
RDW: 13.2 % (ref 11.5–15.5)
WBC: 7 10*3/uL (ref 4.0–10.5)

## 2023-08-26 LAB — LIPASE: Lipase: 26 U/L (ref 11.0–59.0)

## 2023-08-26 NOTE — Progress Notes (Addendum)
OFFICE VISIT  08/26/2023  CC:  Chief Complaint  Patient presents with  . Abdominal discomfort    Discomfort started about a year ago; mainly on right side, right upper quad., under ribs. Has not taken any prescribed/OTC meds. Pt states he feels the discomfort more when lying down. Pt states it is more of a "dull" pain than sharp.     Patient is a 47 y.o. male who presents for abdominal concern.  HPI: For approximately a year Daniel Cardenas has been dealing with constant pain in the right upper quadrant, dull in character.  Waxes and wanes in intensity.  Sometimes he just notices it more than other times.  His appetite is good.  He has no significant nausea, no vomiting.  Denies reflux. Occasionally some mild crampy abdominal pain in various areas but nothing persistent.  No blood in stool, no melena. He has had some IBS type symptoms, mainly precipitated with stress but sometimes with certain foods as well.  ROS as above, plus--> no fevers, no CP, no SOB, no wheezing, no cough, no dizziness, no HAs, no rashes, no melena/hematochezia.  No polyuria or polydipsia.  He has chronic low back pain with intermittent right leg radiculopathy symptoms. No myalgias or arthralgias.  No focal weakness, paresthesias, or tremors.  No acute vision or hearing abnormalities.  No dysuria or unusual/new urinary urgency or frequency.  No recent changes in lower legs.   No palpitations.    Past Medical History:  Diagnosis Date  . COVID-19 virus infection 12/2019  . DDD (degenerative disc disease), lumbar    multilevel, mri 10/2022  . Depression   . Elevated alkaline phosphatase level    Abd u/s normal 04/01/17  . Epigastric pain    EGD 04/2020 normal except a few gastric polyps-->nonulcer dyspepsia  . External hemorrhoid, bleeding   . GAD (generalized anxiety disorder)    citalopram helpful  . GERD (gastroesophageal reflux disease)    daily prilosec OTC helps  . Gilbert syndrome   . H/O clavicle fracture 12/29/2018    ORIF 12/29/2018, revision 01/05/2019 b/c patient fell on clavicle AGAIN.  Marland Kitchen Hay fever   . History of depression   . Obesity, Class II, BMI 35-39.9   . OSA on CPAP 08/2014   Followed by pulm    Past Surgical History:  Procedure Laterality Date  . coronary calcium score  09/04/2020   Zero  . ESOPHAGOGASTRODUODENOSCOPY  04/2020   2021 NORMAL except a few gastric polyps.  Gastric bx: mild reactive gastropathy, H pylori NEG.  02/2022 SAME as 2021  . ETT  08/2020   no ischemia  . ORIF CLAVICULAR FRACTURE Right 12/28/2018   with revision 01/05/2019.  Procedure: OPEN REDUCTION INTERNAL FIXATION (ORIF) CLAVICULAR FRACTURE;  Surgeon: Kathryne Hitch, MD;  Location: [facility];  Service: Orthopedics;  Laterality: Right;  . ORIF CLAVICULAR FRACTURE Right 01/05/2019   Procedure: OPEN REDUCTION INTERNAL FIXATION (ORIF)  RIGHT CLAVICLE FRACTURE;  Surgeon: Kathryne Hitch, MD;  Location: [facility];  Service: Orthopedics;  Laterality: Right;  . WISDOM TOOTH EXTRACTION  1998   MEDS: NONE CURRENTLY Outpatient Medications Prior to Visit  Medication Sig Dispense Refill  . cetirizine (ZYRTEC) 10 MG tablet Take 10 mg by mouth daily.    Marland Kitchen esomeprazole (NEXIUM) 40 MG capsule Take 1 capsule (40 mg total) by mouth 2 (two) times daily before a meal. 60 capsule 1  . predniSONE (DELTASONE) 10 MG tablet 4 tabs po qd x 3d then 3 tabs po qd  x 3d then 2 tabs po qd x 3d then 1 tab po qd x 3d 30 tablet 2   No facility-administered medications prior to visit.    No Known Allergies  Review of Systems  As per HPI  PE:    08/26/2023    8:26 AM 11/15/2022    2:24 PM 09/08/2021   10:37 AM  Vitals with BMI  Height 5\' 9"  5\' 9"  5\' 9"   Weight 248 lbs 6 oz 241 lbs 10 oz 243 lbs 6 oz  BMI 36.67 35.66 35.93  Systolic 123 123 161  Diastolic 85 84 73  Pulse 78 74 76   Physical Exam  Gen: Alert, well appearing.  Patient is oriented to person, place, time, and situation. AFFECT: pleasant, lucid thought and  speech. ENT: EOMI, PERRLA. Nose: no drainage or turbinate edema/swelling.  No injection or focal lesion.  Mouth: lips without lesion/swelling.  Oral mucosa pink and moist.  Dentition intact and without obvious caries or gingival swelling.  Oropharynx without erythema, exudate, or swelling.  Neck: supple/nontender.  No LAD, mass, or TM.   CV: RRR, no m/r/g.   LUNGS: CTA bilat, nonlabored resps, good aeration in all lung fields. ABD: soft, NT, ND, BS normal.  No hepatospenomegaly or mass.  No bruits. EXT: no clubbing, cyanosis, or edema.  Skin - no sores or suspicious lesions or rashes or color changes  LABS:  Last CBC Lab Results  Component Value Date   WBC 8.7 11/15/2022   HGB 16.5 11/15/2022   HCT 47.4 11/15/2022   MCV 94.3 11/15/2022   MCH 33.4 (H) 02/15/2020   RDW 13.1 11/15/2022   PLT 240.0 11/15/2022   Last metabolic panel Lab Results  Component Value Date   GLUCOSE 77 11/15/2022   NA 140 11/15/2022   K 4.4 11/15/2022   CL 103 11/15/2022   CO2 31 11/15/2022   BUN 11 11/15/2022   CREATININE 1.00 11/15/2022   GFR 91.84 11/15/2022   CALCIUM 9.7 11/15/2022   PROT 7.1 11/15/2022   ALBUMIN 4.6 11/15/2022   BILITOT 1.9 (H) 11/15/2022   ALKPHOS 104 11/15/2022   AST 23 11/15/2022   ALT 32 11/15/2022   Last lipids Lab Results  Component Value Date   CHOL 162 11/15/2022   HDL 42.30 11/15/2022   LDLCALC 88 11/15/2022   TRIG 157.0 (H) 11/15/2022   CHOLHDL 4 11/15/2022   Last thyroid functions Lab Results  Component Value Date   TSH 1.73 11/15/2022   IMPRESSION AND PLAN:  Right upper quadrant abdominal pain, chronic. Unknown etiology. Would like to check for any stones or worrisome liver lesion. Ordered right upper quadrant ultrasound today. Check CBC w/diff, cmet, lipase  An After Visit Summary was printed and given to the patient.  FOLLOW UP: Return for TBD depending on results of w/u.  Signed:  Santiago Bumpers, MD           08/26/2023

## 2023-08-26 NOTE — Addendum Note (Signed)
Addended by: Jeoffrey Massed on: 08/26/2023 10:04 AM   Modules accepted: Orders

## 2023-09-02 ENCOUNTER — Ambulatory Visit (HOSPITAL_BASED_OUTPATIENT_CLINIC_OR_DEPARTMENT_OTHER)
Admission: RE | Admit: 2023-09-02 | Discharge: 2023-09-02 | Disposition: A | Discharge status: Home / Self Care | Payer: BC Managed Care – PPO | Source: Ambulatory Visit | Attending: Family Medicine | Admitting: Family Medicine

## 2023-09-02 DIAGNOSIS — R1011 Right upper quadrant pain: Secondary | ICD-10-CM | POA: Diagnosis not present

## 2023-09-02 DIAGNOSIS — K7689 Other specified diseases of liver: Secondary | ICD-10-CM | POA: Diagnosis not present

## 2023-09-05 ENCOUNTER — Other Ambulatory Visit: Payer: Self-pay | Admitting: Family Medicine

## 2023-09-05 ENCOUNTER — Encounter: Payer: Self-pay | Admitting: Family Medicine

## 2023-09-05 MED ORDER — ESOMEPRAZOLE MAGNESIUM 40 MG PO CPDR: 40.0000 mg | DELAYED_RELEASE_CAPSULE | Freq: Two times a day (BID) | ORAL | 1 refills | Status: DC

## 2023-09-17 ENCOUNTER — Encounter: Payer: Self-pay | Admitting: Family Medicine

## 2023-09-19 ENCOUNTER — Other Ambulatory Visit: Payer: Self-pay | Admitting: Family Medicine

## 2023-10-31 ENCOUNTER — Encounter: Payer: Self-pay | Admitting: Family Medicine

## 2023-10-31 ENCOUNTER — Ambulatory Visit: Payer: BC Managed Care – PPO | Admitting: Family Medicine

## 2023-10-31 VITALS — BP 135/87 | HR 93 | Wt 252.0 lb

## 2023-10-31 DIAGNOSIS — R519 Headache, unspecified: Secondary | ICD-10-CM | POA: Diagnosis not present

## 2023-10-31 DIAGNOSIS — F331 Major depressive disorder, recurrent, moderate: Secondary | ICD-10-CM

## 2023-10-31 MED ORDER — BUPROPION HCL ER (XL) 150 MG PO TB24: 150.0000 mg | ORAL_TABLET | Freq: Every day | ORAL | 0 refills | Status: DC

## 2023-10-31 NOTE — Patient Instructions (Signed)
Try riboflavin (B2) 25mg  and Mag oxide 500 mg every night.

## 2023-10-31 NOTE — Progress Notes (Signed)
OFFICE VISIT  10/31/2023  CC:  Chief Complaint  Patient presents with   Headache    Pt c/o of HA's daily for the past few weeks; believes it could be stressed induced.     Patient is a 47 y.o. male who presents for headaches and stress.  HPI: Daniel Cardenas has been struggling a lot with depression over the last year. 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  Daniel Cardenas witnessed these events and has flashbacks of them frequently.  His son is okay, fortunately. Several close friends/church members have been suffering with terminal illness and/or died in the last year. He is still grieving them.  Has had some very stressful situation and his family, including things involving his sister and his mother.  Work is extremely stressful, financial situation tenuous. He feels overwhelmed.  He has benefited from citalopram in the past but felt like it blunted his libido. He states he has not had history of periods of hypomania/mania.  He does have frequent headaches lately.  These are located in the left periorbital region.  No nausea, no dizziness, no visual abnormalities.  Tylenol helps significantly.  ROS as above, plus--> no fevers, no CP, no SOB, no wheezing, no cough,  no rashes, no melena/hematochezia.  No polyuria or polydipsia.  No myalgias or arthralgias.  No focal weakness, paresthesias, or tremors.  No acute vision or hearing abnormalities.  No dysuria or unusual/new urinary urgency or frequency.  No recent changes in lower legs. No n/v/d or abd pain.  No palpitations.    Past Medical History:  Diagnosis Date   COVID-19 virus infection 12/2019   DDD (degenerative disc disease), lumbar    multilevel, mri 10/2022   Depression    Elevated alkaline phosphatase level    Abd u/s normal 04/01/17   Epigastric pain    EGD 04/2020 normal except a few gastric polyps-->nonulcer dyspepsia   External hemorrhoid,  bleeding    GAD (generalized anxiety disorder)    citalopram helpful   GERD (gastroesophageal reflux disease)    daily prilosec OTC helps   Gilbert syndrome    H/O clavicle fracture 12/29/2018   ORIF 12/29/2018, revision 01/05/2019 b/c patient fell on clavicle AGAIN.   Hay fever    Hepatic steatosis    u/s 2024   History of depression    Obesity, Class II, BMI 35-39.9    OSA on CPAP 08/2014   Followed by pulm    Past Surgical History:  Procedure Laterality Date   coronary calcium score  09/04/2020   Zero   ESOPHAGOGASTRODUODENOSCOPY  04/2020   2021 NORMAL except a few gastric polyps.  Gastric bx: mild reactive gastropathy, H pylori NEG.  02/2022 SAME as 2021   ETT  08/2020   no ischemia   ORIF CLAVICULAR FRACTURE Right 12/28/2018   with revision 01/05/2019.  Procedure: OPEN REDUCTION INTERNAL FIXATION (ORIF) CLAVICULAR FRACTURE;  Surgeon: Kathryne Hitch, MD;  Location: [facility];  Service: Orthopedics;  Laterality: Right;   ORIF CLAVICULAR FRACTURE Right 01/05/2019   Procedure: OPEN REDUCTION INTERNAL FIXATION (ORIF)  RIGHT CLAVICLE FRACTURE;  Surgeon: Kathryne Hitch, MD;  Location: [facility];  Service: Orthopedics;  Laterality: Right;   WISDOM TOOTH EXTRACTION  1998    Outpatient Medications Prior to Visit  Medication Sig Dispense Refill   esomeprazole (NEXIUM) 40 MG capsule Take 1 capsule (40 mg total) by mouth 2 (two)  times daily before a meal. 60 capsule 1   No facility-administered medications prior to visit.    No Known Allergies  Review of Systems  As per HPI  PE:    10/31/2023    3:23 PM 08/26/2023    8:26 AM 11/15/2022    2:24 PM  Vitals with BMI  Height  5\' 9"  5\' 9"   Weight 252 lbs 248 lbs 6 oz 241 lbs 10 oz  BMI  36.67 35.66  Systolic 135 123 161  Diastolic 87 85 84  Pulse 93 78 74     Physical Exam  Gen: Alert, well appearing.  Patient is oriented to person, place, time, and situation. Affect is flat, pt tearful during encounter. No  further exam today  LABS:  Last CBC Lab Results  Component Value Date   WBC 7.0 08/26/2023   HGB 16.2 08/26/2023   HCT 48.0 08/26/2023   MCV 95.9 08/26/2023   MCH 33.4 (H) 02/15/2020   RDW 13.2 08/26/2023   PLT 222.0 08/26/2023   Last metabolic panel Lab Results  Component Value Date   GLUCOSE 87 08/26/2023   NA 141 08/26/2023   K 4.2 08/26/2023   CL 106 08/26/2023   CO2 28 08/26/2023   BUN 17 08/26/2023   CREATININE 0.91 08/26/2023   GFR 102.29 08/26/2023   CALCIUM 9.3 08/26/2023   PROT 6.7 08/26/2023   ALBUMIN 4.2 08/26/2023   BILITOT 1.4 (H) 08/26/2023   ALKPHOS 101 08/26/2023   AST 22 08/26/2023   ALT 34 08/26/2023   Lab Results  Component Value Date   CHOL 149 08/26/2023   HDL 36.50 (L) 08/26/2023   LDLCALC 89 08/26/2023   TRIG 120.0 08/26/2023   CHOLHDL 4 08/26/2023   IMPRESSION AND PLAN:  #1 major depressive disorder. Citalopram caused decreased libido when used in the remote past. Will do trial of Wellbutrin XL 150 mg a day. He will likely seek counseling on his own.  2.  Left orbital headaches. These have come on during the time of excessive stress. Reassured of likely benign etiology at this time. Start 500 mg magnesium oxide and 25 mg of riboflavin every night.  An After Visit Summary was printed and given to the patient.  FOLLOW UP: Return for 3-4 wks f/u depression.  Signed:  Santiago Bumpers, MD           10/31/2023

## 2023-11-24 ENCOUNTER — Telehealth: Payer: BC Managed Care – PPO | Admitting: Family Medicine

## 2023-11-24 DIAGNOSIS — J40 Bronchitis, not specified as acute or chronic: Secondary | ICD-10-CM | POA: Diagnosis not present

## 2023-11-24 MED ORDER — DOXYCYCLINE HYCLATE 100 MG PO TABS: 100.0000 mg | ORAL_TABLET | Freq: Two times a day (BID) | ORAL | 0 refills | Status: DC

## 2023-11-24 NOTE — Progress Notes (Signed)
Virtual Visit Consent   Daniel Cardenas, you are scheduled for a virtual visit with a [facility] provider today. Just as with appointments in the office, your consent must be obtained to participate. Your consent will be active for this visit and any virtual visit you may have with one of our providers in the next 365 days. If you have a MyChart account, a copy of this consent can be sent to you electronically.  As this is a virtual visit, video technology does not allow for your provider to perform a traditional examination. This may limit your provider's ability to fully assess your condition. If your provider identifies any concerns that need to be evaluated in person or the need to arrange testing (such as labs, EKG, etc.), we will make arrangements to do so. Although advances in technology are sophisticated, we cannot ensure that it will always work on either your end or our end. If the connection with a video visit is poor, the visit may have to be switched to a telephone visit. With either a video or telephone visit, we are not always able to ensure that we have a secure connection.  By engaging in this virtual visit, you consent to the provision of healthcare and authorize for your insurance to be billed (if applicable) for the services provided during this visit. Depending on your insurance coverage, you may receive a charge related to this service.  I need to obtain your verbal consent now. Are you willing to proceed with your visit today? Daniel Cardenas has provided verbal consent on 11/24/2023 for a virtual visit (video or telephone). Daniel Cardenas, [place]  Date: 11/24/2023 2:42 PM  Virtual Visit via Video Note   I, Daniel Cardenas, connected with  KESHAUN DERIENZO  ([id number], May 05, 1976) on 11/24/23 at  2:30 PM EST by a video-enabled telemedicine application and verified that I am speaking with the correct person using two identifiers.  Location: Patient: Virtual Visit Location Patient:  Home Provider: Virtual Visit Location Provider: Home Office   I discussed the limitations of evaluation and management by telemedicine and the availability of in person appointments. The patient expressed understanding and agreed to proceed.    History of Present Illness: Daniel Cardenas is a 47 y.o. who identifies as a male who was assigned male at birth, and is being seen today for c/o fever and cough for 7 days.  Pt states it is not getting better.  Pt states if he stops taking Tylenol or ibuprofen he spikes a fever.  Pt states daughter also has the same symptoms.  Pt states son also had productive cough x 2 weeks. Pt states has tried some mucinex and sudafed but has not taken anything else. Pt states he has a productive cough that is green-yellow or tan that is thick. Pt states the last time he felt this way he had walking pneumonia.   HPI: HPI  Problems:  Patient Active Problem List   Diagnosis Date Noted   Educated about COVID-19 virus infection 08/13/2020   Precordial chest pain 08/13/2020   Closed displaced fracture of right clavicle 12/28/2018   Right clavicle fracture 12/28/2018   Elevated alkaline phosphatase level 10/23/2014   GERD (gastroesophageal reflux disease) 08/22/2013   OSA (obstructive sleep apnea) 08/22/2013   Health maintenance examination 05/14/2013   GAD (generalized anxiety disorder) 02/26/2013    Allergies: No Known Allergies Medications:  Current Outpatient Medications:    buPROPion (WELLBUTRIN XL) 150 MG 24 hr tablet, Take  1 tablet (150 mg total) by mouth daily., Disp: 30 tablet, Rfl: 0   esomeprazole (NEXIUM) 40 MG capsule, Take 1 capsule (40 mg total) by mouth 2 (two) times daily before a meal., Disp: 60 capsule, Rfl: 1  Observations/Objective: Patient is well-developed, well-nourished in no acute distress.  Resting comfortably at home.  Head is normocephalic, atraumatic.  No labored breathing.  Speech is clear and coherent with logical content.   Patient is alert and oriented at baseline.    Assessment and Plan: 1. Bronchitis  -Start Doxycycline -Advised Pt if worsening symptoms to proceed to the urgent care or emergency room for further evaluation  -Pt verbalized understanding   Follow Up Instructions: I discussed the assessment and treatment plan with the patient. The patient was provided an opportunity to ask questions and all were answered. The patient agreed with the plan and demonstrated an understanding of the instructions.  A copy of instructions were sent to the patient via MyChart unless otherwise noted below.     The patient was advised to call back or seek an in-person evaluation if the symptoms worsen or if the condition fails to improve as anticipated.    Daniel Pandy, PA-C

## 2023-11-24 NOTE — Patient Instructions (Signed)
Daniel Cardenas, thank you for joining Reed Pandy, PA-C for today's virtual visit.  While this provider is not your primary care provider (PCP), if your PCP is located in our provider database this encounter information will be shared with them immediately following your visit.   A [facility] MyChart account gives you access to today's visit and all your visits, tests, and labs performed at [facility] " click here if you don't have a [facility] MyChart account or go to [email or web address]  Consent: (Patient) Daniel Cardenas provided verbal consent for this virtual visit at the beginning of the encounter.  Current Medications:  Current Outpatient Medications:    doxycycline (VIBRA-TABS) 100 MG tablet, Take 1 tablet (100 mg total) by mouth 2 (two) times daily for 7 days., Disp: 14 tablet, Rfl: 0   buPROPion (WELLBUTRIN XL) 150 MG 24 hr tablet, Take 1 tablet (150 mg total) by mouth daily., Disp: 30 tablet, Rfl: 0   esomeprazole (NEXIUM) 40 MG capsule, Take 1 capsule (40 mg total) by mouth 2 (two) times daily before a meal., Disp: 60 capsule, Rfl: 1   Medications ordered in this encounter:  Meds ordered this encounter  Medications   doxycycline (VIBRA-TABS) 100 MG tablet    Sig: Take 1 tablet (100 mg total) by mouth 2 (two) times daily for 7 days.    Dispense:  14 tablet    Refill:  0     *If you need refills on other medications prior to your next appointment, please contact your pharmacy*  Follow-Up: Call back or seek an in-person evaluation if the symptoms worsen or if the condition fails to improve as anticipated.  [facility] [phone]  Other Instructions Acute Bronchitis, Adult  Acute bronchitis is sudden inflammation of the main airways (bronchi) that come off the windpipe (trachea) in the lungs. The swelling causes the airways to get smaller and make more mucus than normal. This can make it hard to breathe and can cause coughing or  noisy breathing (wheezing). Acute bronchitis may last several weeks. The cough may last longer. Allergies, asthma, and exposure to smoke may make the condition worse. What are the causes? This condition can be caused by germs and by substances that irritate the lungs, including: Cold and flu viruses. The most common cause of this condition is the virus that causes the common cold. Bacteria. This is less common. Breathing in substances that irritate the lungs, including: Smoke from cigarettes and other forms of tobacco. Dust and pollen. Fumes from household cleaning products, gases, or burned fuel. Indoor or outdoor air pollution. What increases the risk? The following factors may make you more likely to develop this condition: A weak body's defense system, also called the immune system. A condition that affects your lungs and breathing, such as asthma. What are the signs or symptoms? Common symptoms of this condition include: Coughing. This may bring up clear, yellow, or green mucus from your lungs (sputum). Wheezing. Runny or stuffy nose. Having too much mucus in your lungs (chest congestion). Shortness of breath. Aches and pains, including sore throat or chest. How is this diagnosed? This condition is usually diagnosed based on: Your symptoms and medical history. A physical exam. You may also have other tests, including tests to rule out other conditions, such as pneumonia. These tests include: A test of lung function. Test of a mucus sample to look for the presence of bacteria. Tests to check the oxygen level in your blood. Blood  tests. Chest X-ray. How is this treated? Most cases of acute bronchitis clear up over time without treatment. Your health care provider may recommend: Drinking more fluids to help thin your mucus so it is easier to cough up. Taking inhaled medicine (inhaler) to improve air flow in and out of your lungs. Using a vaporizer or a humidifier. These are  machines that add water to the air to help you breathe better. Taking a medicine that thins mucus and clears congestion (expectorant). Taking a medicine that prevents or stops coughing (cough suppressant). It is not common to take an antibiotic medicine for this condition. Follow these instructions at home:  Take over-the-counter and prescription medicines only as told by your health care provider. Use an inhaler, vaporizer, or humidifier as told by your health care provider. Take two teaspoons (10 mL) of honey at bedtime to lessen coughing at night. Drink enough fluid to keep your urine pale yellow. Do not use any products that contain nicotine or tobacco. These products include cigarettes, chewing tobacco, and vaping devices, such as e-cigarettes. If you need help quitting, ask your health care provider. Get plenty of rest. Return to your normal activities as told by your health care provider. Ask your health care provider what activities are safe for you. Keep all follow-up visits. This is important. How is this prevented? To lower your risk of getting this condition again: Wash your hands often with soap and water for at least 20 seconds. If soap and water are not available, use hand sanitizer. Avoid contact with people who have cold symptoms. Try not to touch your mouth, nose, or eyes with your hands. Avoid breathing in smoke or chemical fumes. Breathing smoke or chemical fumes will make your condition worse. Get the flu shot every year. Contact a health care provider if: Your symptoms do not improve after 2 weeks. You have trouble coughing up the mucus. Your cough keeps you awake at night. You have a fever. Get help right away if you: Cough up blood. Feel pain in your chest. Have severe shortness of breath. Faint or keep feeling like you are going to faint. Have a severe headache. Have a fever or chills that get worse. These symptoms may represent a serious problem that is an  emergency. Do not wait to see if the symptoms will go away. Get medical help right away. Call your local emergency services (911 in the [place] Do not drive yourself to the hospital. Summary Acute bronchitis is inflammation of the main airways (bronchi) that come off the windpipe (trachea) in the lungs. The swelling causes the airways to get smaller and make more mucus than normal. Drinking more fluids can help thin your mucus so it is easier to cough up. Take over-the-counter and prescription medicines only as told by your health care provider. Do not use any products that contain nicotine or tobacco. These products include cigarettes, chewing tobacco, and vaping devices, such as e-cigarettes. If you need help quitting, ask your health care provider. Contact a health care provider if your symptoms do not improve after 2 weeks. This information is not intended to replace advice given to you by your health care provider. Make sure you discuss any questions you have with your health care provider. Document Revised: 03/25/2022 Document Reviewed: 04/15/2021 Elsevier Patient Education  2024 [employer].    If you have been instructed to have an in-person evaluation today at a local Urgent Care facility, please use the link below. It will take  you to a list of all of our available [facility], including address, phone number and hours of operation. Please do not delay care.  [facility]  If you or a family member do not have a primary care provider, use the link below to schedule a visit and establish care. When you choose a [facility] primary care physician or advanced practice provider, you gain a long-term partner in health. Find a Primary Care Provider  Learn more about [facility] in-office and virtual care options: [facility] Get Care Now

## 2023-11-28 ENCOUNTER — Ambulatory Visit (INDEPENDENT_AMBULATORY_CARE_PROVIDER_SITE_OTHER): Payer: BC Managed Care – PPO | Admitting: Urgent Care

## 2023-11-28 VITALS — BP 123/83 | HR 83 | Temp 98.3°F | Wt 249.0 lb

## 2023-11-28 DIAGNOSIS — J22 Unspecified acute lower respiratory infection: Secondary | ICD-10-CM | POA: Diagnosis not present

## 2023-11-28 MED ORDER — AZITHROMYCIN 250 MG PO TABS: ORAL_TABLET | ORAL | 0 refills | Status: AC

## 2023-11-28 MED ORDER — AMOXICILLIN-POT CLAVULANATE 875-125 MG PO TABS: 1.0000 | ORAL_TABLET | Freq: Two times a day (BID) | ORAL | 0 refills | Status: DC

## 2023-11-28 NOTE — Progress Notes (Signed)
Established Patient Office Visit  Subjective:  Patient ID: Daniel Cardenas, male    DOB: 12-Mar-1976  Age: 47 y.o. MRN: [id number]  Chief Complaint  Patient presents with   Fever    Fever and cold symptoms since 11/22. He was seen virtual at an urgent care on 11/28 and was prescribed Doxycycline which did not help symptoms.    Pleasant 47yo male presents today due to continued cough and fever. States that on 11/18/23, he started with a fever as high as 103.7, sore throat, chills, rigors and cough. His 9yo daughter had similar sx. She was tested for covid and flu and was negative, thus he never got testing but assumes it was also normal. He took alternating ibuprofen/ tylenol to help with his symptoms, but on 11/24/23 completed a video visit. He was prescribed doxycycline 100mg  BID. He has completed 4 days of the antibiotic, but states he is not feeling significantly better. He states his fever dropped from the 103s down to the 101s. His sputum which used to be the color of "peanut butter" is now more clear, but still not feeling well. Continued harsh cough, and continued sore throat although this has also improved significantly. Pt does not smoke, has hx of OSA.  Fever     Patient Active Problem List   Diagnosis Date Noted   Educated about COVID-19 virus infection 08/13/2020   Precordial chest pain 08/13/2020   Closed displaced fracture of right clavicle 12/28/2018   Right clavicle fracture 12/28/2018   Elevated alkaline phosphatase level 10/23/2014   GERD (gastroesophageal reflux disease) 08/22/2013   OSA (obstructive sleep apnea) 08/22/2013   Health maintenance examination 05/14/2013   GAD (generalized anxiety disorder) 02/26/2013   Past Medical History:  Diagnosis Date   COVID-19 virus infection 12/2019   DDD (degenerative disc disease), lumbar    multilevel, mri 10/2022   Depression    Elevated alkaline phosphatase level    Abd u/s normal 04/01/17   Epigastric pain    EGD  04/2020 normal except a few gastric polyps-->nonulcer dyspepsia   External hemorrhoid, bleeding    GAD (generalized anxiety disorder)    citalopram helpful   GERD (gastroesophageal reflux disease)    daily prilosec OTC helps   Gilbert syndrome    H/O clavicle fracture 12/29/2018   ORIF 12/29/2018, revision 01/05/2019 b/c patient fell on clavicle AGAIN.   Hay fever    Hepatic steatosis    u/s 2024   History of depression    Obesity, Class II, BMI 35-39.9    OSA on CPAP 08/2014   Followed by pulm   Social History   Tobacco Use   Smoking status: Never   Smokeless tobacco: Never  Vaping Use   Vaping status: Never Used  Substance Use Topics   Alcohol use: Yes    Comment: occasional-- 1 beer every 3 days or so   Drug use: No      ROS: as noted in HPI  Objective:     BP 123/83   Pulse 83   Temp 98.3 F (36.8 C) (Oral)   Wt 249 lb (112.9 kg)   SpO2 98%   BMI 36.77 kg/m  BP Readings from Last 3 Encounters:  11/28/23 123/83  10/31/23 135/87  08/26/23 123/85   Wt Readings from Last 3 Encounters:  11/28/23 249 lb (112.9 kg)  10/31/23 252 lb (114.3 kg)  08/26/23 248 lb 6.4 oz (112.7 kg)      Physical Exam Vitals and nursing note  reviewed. Exam conducted with a chaperone present.  Constitutional:      General: He is not in acute distress.    Appearance: Normal appearance. He is ill-appearing. He is not toxic-appearing or diaphoretic.  HENT:     Head: Normocephalic and atraumatic.     Right Ear: Ear canal and external ear normal. A middle ear effusion is present. There is no impacted cerumen. Tympanic membrane is not injected, perforated, erythematous or bulging.     Left Ear: A middle ear effusion is present. Tympanic membrane is not injected, perforated, erythematous or bulging.     Nose: Congestion and rhinorrhea present. Rhinorrhea is purulent.     Right Sinus: No maxillary sinus tenderness or frontal sinus tenderness.     Left Sinus: No maxillary sinus tenderness  or frontal sinus tenderness.     Mouth/Throat:     Lips: Pink.     Mouth: Mucous membranes are moist.     Pharynx: Oropharynx is clear. Uvula midline. Posterior oropharyngeal erythema present. No pharyngeal swelling, oropharyngeal exudate or uvula swelling.  Cardiovascular:     Rate and Rhythm: Normal rate and regular rhythm.  Pulmonary:     Effort: Pulmonary effort is normal. No respiratory distress.     Breath sounds: No stridor. Rhonchi (RLL, minimally increased tactile fremitus but negative bronchophony) present. No wheezing or rales.  Chest:     Chest wall: No tenderness.  Musculoskeletal:     Cervical back: Normal range of motion and neck supple. No rigidity or tenderness.  Lymphadenopathy:     Cervical: No cervical adenopathy.  Skin:    General: Skin is warm and dry.     Coloration: Skin is not jaundiced.     Findings: No bruising, erythema or rash.  Neurological:     General: No focal deficit present.     Mental Status: He is alert and oriented to person, place, and time.      No results found for any visits on 11/28/23.  Last CBC Lab Results  Component Value Date   WBC 7.0 08/26/2023   HGB 16.2 08/26/2023   HCT 48.0 08/26/2023   MCV 95.9 08/26/2023   MCH 33.4 (H) 02/15/2020   RDW 13.2 08/26/2023   PLT 222.0 08/26/2023      The 10-year ASCVD risk score (Arnett DK, et al., 2019) is: 1.7%  Assessment & Plan:  Lower respiratory infection -     Azithromycin; Take 2 tablets on day 1, then 1 tablet daily on days 2 through 5  Dispense: 6 tablet; Refill: 0 -     Amoxicillin-Pot Clavulanate; Take 1 tablet by mouth 2 (two) times daily with a meal for 7 days.  Dispense: 14 tablet; Refill: 0  VSS, pt is non-toxic. Minimal improvement with doxycycline per pt. Will therefore DC doxy and switch to combo Augmentin/ Azithro. Encouraged rest and adequate hydration. Return if sx are not nearly resolved within 5-7 days. ER precautions discussed.   No follow-ups on file.    Maretta Bees, PA

## 2023-11-28 NOTE — Patient Instructions (Signed)
Please discontinue the doxycycline.  Please start taking Augmentin twice daily with food. Take Azithromycin per package directions.  Drink plenty of water. Please also taking plain mucinex (without decongestant or cough suppressant) to help get rid of the mucous.  If your symptoms persist after completion of 5 days of medications, please contact our office for further instructions. If you develop severe shortness of breath or chest pain, please head to the ER

## 2023-11-29 ENCOUNTER — Other Ambulatory Visit: Payer: Self-pay | Admitting: Family Medicine

## 2023-12-05 ENCOUNTER — Encounter: Payer: Self-pay | Admitting: Urgent Care

## 2023-12-05 ENCOUNTER — Telehealth (INDEPENDENT_AMBULATORY_CARE_PROVIDER_SITE_OTHER): Payer: BC Managed Care – PPO | Admitting: Urgent Care

## 2023-12-05 DIAGNOSIS — J22 Unspecified acute lower respiratory infection: Secondary | ICD-10-CM | POA: Diagnosis not present

## 2023-12-05 MED ORDER — AMOXICILLIN-POT CLAVULANATE 875-125 MG PO TABS: 1.0000 | ORAL_TABLET | Freq: Two times a day (BID) | ORAL | 0 refills | Status: AC

## 2023-12-05 NOTE — Progress Notes (Signed)
Virtual Visit via Video Note  I connected with Daniel Cardenas on 12/05/23 at  2:40 PM EST by a video enabled telemedicine application and verified that I am speaking with the correct person using two identifiers.  Patient Location: Other:  car, parked Provider Location: Office/Clinic  I discussed the limitations, risks, security, and privacy concerns of performing an evaluation and management service by video and the availability of in person appointments. I also discussed with the patient that there may be a patient responsible charge related to this service. The patient expressed understanding and agreed to proceed.   Subjective  PCP: Jeoffrey Massed, MD  No chief complaint on file.   HPI: Patient complains of cough, head/chest congestion, and fever.  He denies sneezing, sore throat, ear pain/pressure, shortness of breath, wheezing, and nausea.  Onset of symptoms was 3 days ago, rapidly worsening since that time.  He is drinking plenty of fluids. Evaluation to date: none Pt has been seen twice now for similar sx. He stated that he actually started to feel like his sx resolved over the weekend, but upon completing his Augmentin course, he states his sx rapidly returned and he is worsening.  Treatment to date: antibiotics. (Completed 7 days of doxycycline and Augment, pt never took the azithromycin)  He does not smoke.   ROS: Per HPI. All other pertinent systems are negative.   Current Outpatient Medications:    amoxicillin-clavulanate (AUGMENTIN) 875-125 MG tablet, Take 1 tablet by mouth 2 (two) times daily with a meal for 7 days., Disp: 14 tablet, Rfl: 0   buPROPion (WELLBUTRIN XL) 150 MG 24 hr tablet, Take 1 tablet (150 mg total) by mouth daily., Disp: 30 tablet, Rfl: 0   esomeprazole (NEXIUM) 40 MG capsule, Take 1 capsule (40 mg total) by mouth 2 (two) times daily before a meal., Disp: 60 capsule, Rfl: 1    Objective  Vital signs not able to be obtained due to this being  a virtual visit.  Physical Exam Constitutional:      General: He is not in acute distress.    Appearance: Normal appearance. He is ill-appearing. He is not toxic-appearing or diaphoretic.     Comments: Nasally/ congested voice quality  HENT:     Head: Normocephalic and atraumatic.     Nose: Congestion and rhinorrhea present.     Comments: Erythema noted to nose, clear rhinorrhea noted anteriorly Eyes:     General: No scleral icterus.       Right eye: No discharge.        Left eye: No discharge.  Pulmonary:     Effort: Pulmonary effort is normal. No respiratory distress.     Comments: No accessory muscle use noted Pt able to speak in complete sentences without breathlessness Dry cough noted Skin:    General: Skin is warm and dry.     Coloration: Skin is not jaundiced.  Neurological:     Mental Status: He is alert and oriented to person, place, and time.      Assessment & Plan Lower respiratory infection  1. Lower respiratory infection - amoxicillin-clavulanate (AUGMENTIN) 875-125 MG tablet; Take 1 tablet by mouth 2 (two) times daily with a meal for 7 days. - DG Chest 2 View; Future   Pt had significant improvement/ resolution with Augmentin. He completed a 7 day course. Per UTD guidelines, a 5-10 day course is typical, but sometimes 14 days required. Therefore, will do an additional 7 days of Augmentin. Pt never  took the Zpack, encouraged to complete pack. Continue drinking plenty of water, mucinex 600mg  twice daily. If sx persist despite above treatment, pt to obtain CXR to assess for other causes. Will also consider sputum culture and labs if indicated pending response to therapy. RTC precautions discussed.   No follow-ups on file.   I discussed the assessment and treatment plan with the patient. The patient was provided an opportunity to ask questions, and all were answered. The patient agreed with the plan and demonstrated an understanding of the instructions.   The  patient was advised to call back or seek an in-person evaluation if the symptoms worsen or if the condition fails to improve as anticipated.  The above assessment and management plan was discussed with the patient. The patient verbalized understanding of and has agreed to the management plan.   Maretta Bees, PA

## 2023-12-05 NOTE — Patient Instructions (Addendum)
Re-start a second 7 day course of Augmentin (to complete 14 days in total) Please take and complete the Azithromycin.  OTC probiotics such as Florastor and daily yogurt with active and live cultures can help offset the possible GI side effects from the antibiotics.  Continue taking plain mucinex 600mg  twice daily with plenty of water.  If you continue to have symptoms despite the antibiotic treatment, please obtain a chest xray. The order has been placed at [facility]; you do not need an appointment for the xray and can show up to have this completed between 8-5 M-F.  Please contact us immediately if your symptoms persist or worsen.

## 2023-12-06 ENCOUNTER — Ambulatory Visit (HOSPITAL_BASED_OUTPATIENT_CLINIC_OR_DEPARTMENT_OTHER)
Admission: RE | Admit: 2023-12-06 | Discharge: 2023-12-06 | Disposition: A | Discharge status: Home / Self Care | Payer: BC Managed Care – PPO | Source: Ambulatory Visit | Attending: Urgent Care | Admitting: Urgent Care

## 2023-12-06 DIAGNOSIS — R509 Fever, unspecified: Secondary | ICD-10-CM | POA: Diagnosis not present

## 2023-12-06 DIAGNOSIS — J22 Unspecified acute lower respiratory infection: Secondary | ICD-10-CM | POA: Insufficient documentation

## 2023-12-06 DIAGNOSIS — R059 Cough, unspecified: Secondary | ICD-10-CM | POA: Diagnosis not present

## 2023-12-16 ENCOUNTER — Encounter: Payer: Self-pay | Admitting: Family Medicine

## 2023-12-16 ENCOUNTER — Telehealth (INDEPENDENT_AMBULATORY_CARE_PROVIDER_SITE_OTHER): Payer: BC Managed Care – PPO | Admitting: Family Medicine

## 2023-12-16 DIAGNOSIS — F331 Major depressive disorder, recurrent, moderate: Secondary | ICD-10-CM | POA: Diagnosis not present

## 2023-12-16 MED ORDER — BUPROPION HCL ER (XL) 150 MG PO TB24: 150.0000 mg | ORAL_TABLET | Freq: Every day | ORAL | 1 refills | Status: DC

## 2023-12-16 NOTE — Progress Notes (Signed)
Virtual Visit via Video Note  I connected with Daniel Cardenas  on 12/16/23 at  3:20 PM EST by a video enabled telemedicine application and verified that I am speaking with the correct person using two identifiers.  Location patient: [place] Location provider:work or home office Persons participating in the virtual visit: patient, provider  I discussed the limitations and requested verbal permission for telemedicine visit. The patient expressed understanding and agreed to proceed.  CC:  47 year old male being seen today for 6-week follow-up depression A/P as of last visit: "#1 major depressive disorder. Citalopram caused decreased libido when used in the remote past. Will do trial of Wellbutrin XL 150 mg a day. He will likely seek counseling on his own.   2.  Left orbital headaches. These have come on during the time of excessive stress. Reassured of likely benign etiology at this time. Start 500 mg magnesium oxide and 25 mg of riboflavin every night."  INTERIM HX: Daniel Cardenas had a significant respiratory infection since I last saw him and was not able to take the bupropion on a consistent basis. Not on it long enough to really tell whether it was helping.  He does not have any adverse side effects from it.   ROS: See pertinent positives and negatives per HPI.  Past Medical History:  Diagnosis Date   COVID-19 virus infection 12/2019   DDD (degenerative disc disease), lumbar    multilevel, mri 10/2022   Depression    Elevated alkaline phosphatase level    Abd u/s normal 04/01/17   Epigastric pain    EGD 04/2020 normal except a few gastric polyps-->nonulcer dyspepsia   External hemorrhoid, bleeding    GAD (generalized anxiety disorder)    citalopram helpful   GERD (gastroesophageal reflux disease)    daily prilosec OTC helps   Gilbert syndrome    H/O clavicle fracture 12/29/2018   ORIF 12/29/2018, revision 01/05/2019 b/c patient fell on clavicle AGAIN.   Hay fever    Hepatic steatosis    u/s 2024    History of depression    Obesity, Class II, BMI 35-39.9    OSA on CPAP 08/2014   Followed by pulm    Past Surgical History:  Procedure Laterality Date   coronary calcium score  09/04/2020   Zero   ESOPHAGOGASTRODUODENOSCOPY  04/2020   2021 NORMAL except a few gastric polyps.  Gastric bx: mild reactive gastropathy, H pylori NEG.  02/2022 SAME as 2021   ETT  08/2020   no ischemia   ORIF CLAVICULAR FRACTURE Right 12/28/2018   with revision 01/05/2019.  Procedure: OPEN REDUCTION INTERNAL FIXATION (ORIF) CLAVICULAR FRACTURE;  Surgeon: Kathryne Hitch, MD;  Location: [facility];  Service: Orthopedics;  Laterality: Right;   ORIF CLAVICULAR FRACTURE Right 01/05/2019   Procedure: OPEN REDUCTION INTERNAL FIXATION (ORIF)  RIGHT CLAVICLE FRACTURE;  Surgeon: Kathryne Hitch, MD;  Location: [facility];  Service: Orthopedics;  Laterality: Right;   WISDOM TOOTH EXTRACTION  1998     Current Outpatient Medications:    buPROPion (WELLBUTRIN XL) 150 MG 24 hr tablet, Take 1 tablet (150 mg total) by mouth daily., Disp: 30 tablet, Rfl: 0   esomeprazole (NEXIUM) 40 MG capsule, Take 1 capsule (40 mg total) by mouth 2 (two) times daily before a meal., Disp: 60 capsule, Rfl: 1  EXAM:  VITALS per patient if applicable:     11/28/2023   10:32 AM 10/31/2023    3:23 PM 08/26/2023    8:26 AM  Vitals with BMI  Height   5\' 9"   Weight 249 lbs 252 lbs 248 lbs 6 oz  BMI   36.67  Systolic 123 135 409  Diastolic 83 87 85  Pulse 83 93 78    GENERAL: alert, oriented, appears well and in no acute distress  HEENT: atraumatic, conjunttiva clear, no obvious abnormalities on inspection of external nose and ears  NECK: normal movements of the head and neck  LUNGS: on inspection no signs of respiratory distress, breathing rate appears normal, no obvious gross SOB, gasping or wheezing  CV: no obvious cyanosis  MS: moves all visible extremities without noticeable abnormality  PSYCH/NEURO: pleasant and  cooperative, no obvious depression or anxiety, speech and thought processing grossly intact  LABS: none today  ASSESSMENT AND PLAN:  Discussed the following assessment and plan:  Major depressive disorder. We will get him back on the 150 mg dose every day for a couple of weeks straight. After this he can increase to 2 of the 150 tabs daily.  He will call if he makes this increase so I can get him a prescription sent in for the 300 mg XL tabs.  He can send me a MyChart message or call with questions or updates. Otherwise I will see him again in 4 to 6 weeks. Either virtual or in person, whichever he prefers.   I discussed the assessment and treatment plan with the patient. The patient was provided an opportunity to ask questions and all were answered. The patient agreed with the plan and demonstrated an understanding of the instructions.   F/u: 4-6 wks  Signed:  Santiago Bumpers, MD           12/16/2023

## 2024-01-23 ENCOUNTER — Encounter: Payer: Self-pay | Admitting: Family Medicine

## 2024-02-19 ENCOUNTER — Other Ambulatory Visit: Payer: Self-pay | Admitting: Family Medicine

## 2024-02-20 ENCOUNTER — Other Ambulatory Visit: Payer: Self-pay

## 2024-02-20 MED ORDER — BUPROPION HCL ER (XL) 150 MG PO TB24: 150.0000 mg | ORAL_TABLET | Freq: Every day | ORAL | 0 refills | Status: DC

## 2024-03-01 ENCOUNTER — Encounter: Payer: Self-pay | Admitting: Family Medicine

## 2024-03-01 ENCOUNTER — Telehealth: Payer: BC Managed Care – PPO | Admitting: Family Medicine

## 2024-03-01 DIAGNOSIS — F331 Major depressive disorder, recurrent, moderate: Secondary | ICD-10-CM

## 2024-03-01 MED ORDER — BUPROPION HCL ER (XL) 300 MG PO TB24: 300.0000 mg | ORAL_TABLET | Freq: Every day | ORAL | 1 refills | Status: DC

## 2024-03-01 NOTE — Progress Notes (Signed)
 Virtual Visit via Video Note  I connected with Daniel Cardenas  on 03/01/24 at  1:40 PM EST by a video enabled telemedicine application and verified that I am speaking with the correct person using two identifiers.  Location patient: [place] Location provider:work or home office Persons participating in the virtual visit: patient, provider  I discussed the limitations and requested verbal permission for telemedicine visit. The patient expressed understanding and agreed to proceed.  CC:  48 year old male being seen today for 14-month follow-up depression. A/P as of last visit: "Major depressive disorder. We will get him back on the 150 mg dose every day for a couple of weeks straight. After this he can increase to 2 of the 150 tabs daily.  He will call if he makes this increase so I can get him a prescription sent in for the 300 mg XL tabs.  He can send me a MyChart message or call with questions or updates. Otherwise I will see him again in 4 to 6 weeks. Either virtual or in person, whichever he prefers."  INTERIM HX: Has been taking the Wellbutrin XL once a day, sometimes twice a day if he remembers. Still struggling with anhedonia, intrusive depressed thoughts, easily distracted, chronic worry. No SI or HI. He is in the process of grieving for the loss of several close loved ones in the last year. Sleep is fine.  Appetite is normal or increased.  ROS: See pertinent positives and negatives per HPI.  Past Medical History:  Diagnosis Date   COVID-19 virus infection 12/2019   DDD (degenerative disc disease), lumbar    multilevel, mri 10/2022   Depression    Elevated alkaline phosphatase level    Abd u/s normal 04/01/17   Epigastric pain    EGD 04/2020 normal except a few gastric polyps-->nonulcer dyspepsia   External hemorrhoid, bleeding    GAD (generalized anxiety disorder)    citalopram helpful   GERD (gastroesophageal reflux disease)    daily prilosec OTC helps   Gilbert syndrome    H/O  clavicle fracture 12/29/2018   ORIF 12/29/2018, revision 01/05/2019 b/c patient fell on clavicle AGAIN.   Hay fever    Hepatic steatosis    u/s 2024   History of depression    Obesity, Class II, BMI 35-39.9    OSA on CPAP 08/2014   Followed by pulm    Past Surgical History:  Procedure Laterality Date   coronary calcium score  09/04/2020   Zero   ESOPHAGOGASTRODUODENOSCOPY  04/2020   2021 NORMAL except a few gastric polyps.  Gastric bx: mild reactive gastropathy, H pylori NEG.  02/2022 SAME as 2021   ETT  08/2020   no ischemia   ORIF CLAVICULAR FRACTURE Right 12/28/2018   with revision 01/05/2019.  Procedure: OPEN REDUCTION INTERNAL FIXATION (ORIF) CLAVICULAR FRACTURE;  Surgeon: Kathryne Hitch, MD;  Location: [facility];  Service: Orthopedics;  Laterality: Right;   ORIF CLAVICULAR FRACTURE Right 01/05/2019   Procedure: OPEN REDUCTION INTERNAL FIXATION (ORIF)  RIGHT CLAVICLE FRACTURE;  Surgeon: Kathryne Hitch, MD;  Location: [facility];  Service: Orthopedics;  Laterality: Right;   WISDOM TOOTH EXTRACTION  1998     Current Outpatient Medications:    buPROPion (WELLBUTRIN XL) 150 MG 24 hr tablet, Take 1 tablet (150 mg total) by mouth daily., Disp: 30 tablet, Rfl: 0   esomeprazole (NEXIUM) 40 MG capsule, Take 1 capsule (40 mg total) by mouth 2 (two) times daily before a meal., Disp: 60 capsule, Rfl: 1  EXAM:  VITALS per patient if applicable:      11/28/2023   10:32 AM 10/31/2023    3:23 PM 08/26/2023    8:26 AM  Vitals with BMI  Height   5\' 9"   Weight 249 lbs 252 lbs 248 lbs 6 oz  BMI   36.67  Systolic 123 135 161  Diastolic 83 87 85  Pulse 83 93 78    GENERAL: alert, oriented, appears well and in no acute distress  HEENT: atraumatic, conjunttiva clear, no obvious abnormalities on inspection of external nose and ears  NECK: normal movements of the head and neck  LUNGS: on inspection no signs of respiratory distress, breathing rate appears normal, no obvious gross  SOB, gasping or wheezing  CV: no obvious cyanosis  MS: moves all visible extremities without noticeable abnormality  PSYCH/NEURO: pleasant and cooperative, no obvious depression or anxiety, speech and thought processing grossly intact  LABS: none today    Chemistry      Component Value Date/Time   NA 141 08/26/2023 0909   K 4.2 08/26/2023 0909   CL 106 08/26/2023 0909   CO2 28 08/26/2023 0909   BUN 17 08/26/2023 0909   CREATININE 0.91 08/26/2023 0909   CREATININE 0.91 02/15/2020 1526      Component Value Date/Time   CALCIUM 9.3 08/26/2023 0909   ALKPHOS 101 08/26/2023 0909   AST 22 08/26/2023 0909   ALT 34 08/26/2023 0909   BILITOT 1.4 (H) 08/26/2023 0909     ASSESSMENT AND PLAN:  Discussed the following assessment and plan:  Recurrent major depressive disorder. Increase Wellbutrin XL to 300 mg once a day. Will see how a consistent dose of this strength helps. Additionally, he has a seasonal affect of component and the weather is starting to improve now so hopefully this will bring some improvement in his mood as well.   I discussed the assessment and treatment plan with the patient. The patient was provided an opportunity to ask questions and all were answered. The patient agreed with the plan and demonstrated an understanding of the instructions.   F/u: 2 mo  Signed:  Santiago Bumpers, MD           03/01/2024

## 2024-03-25 ENCOUNTER — Other Ambulatory Visit: Payer: Self-pay | Admitting: Family Medicine

## 2024-05-06 ENCOUNTER — Other Ambulatory Visit: Payer: Self-pay | Admitting: Family Medicine

## 2024-05-11 ENCOUNTER — Encounter: Payer: Self-pay | Admitting: Family Medicine

## 2024-05-11 ENCOUNTER — Telehealth (INDEPENDENT_AMBULATORY_CARE_PROVIDER_SITE_OTHER): Admitting: Family Medicine

## 2024-05-11 VITALS — Ht 69.0 in | Wt 255.0 lb

## 2024-05-11 DIAGNOSIS — F3342 Major depressive disorder, recurrent, in full remission: Secondary | ICD-10-CM | POA: Diagnosis not present

## 2024-05-11 DIAGNOSIS — K219 Gastro-esophageal reflux disease without esophagitis: Secondary | ICD-10-CM | POA: Diagnosis not present

## 2024-05-11 DIAGNOSIS — F411 Generalized anxiety disorder: Secondary | ICD-10-CM

## 2024-05-11 MED ORDER — ESOMEPRAZOLE MAGNESIUM 40 MG PO CPDR: 40.0000 mg | DELAYED_RELEASE_CAPSULE | Freq: Two times a day (BID) | ORAL | 3 refills | Status: AC

## 2024-05-11 MED ORDER — BUPROPION HCL ER (XL) 300 MG PO TB24: 300.0000 mg | ORAL_TABLET | Freq: Every day | ORAL | 3 refills | Status: AC

## 2024-05-11 NOTE — Progress Notes (Signed)
 Virtual Visit via Video Note  I connected with Daniel Cardenas  on 05/11/24 at  3:00 PM EDT by a video enabled telemedicine application and verified that I am speaking with the correct person using two identifiers.  Location patient: [place] Location provider:work or home office Persons participating in the virtual visit: patient, provider  I discussed the limitations and requested verbal permission for telemedicine visit. The patient expressed understanding and agreed to proceed.  CC: 48 y/o male being seen today for 2 mo f/u anx/dep. A/P as of last visit: "Recurrent major depressive disorder. Increase Wellbutrin  XL to 300 mg once a day. Will see how a consistent dose of this strength helps. Additionally, he has a seasonal affect of component and the weather is starting to improve now so hopefully this will bring some improvement in his mood as well."  INTERIM HX: Daniel Cardenas is doing well. Mood is good, anxiety level is good. No side effects from Wellbutrin .  ROS: See pertinent positives and negatives per HPI.  Past Medical History:  Diagnosis Date   COVID-19 virus infection 12/2019   DDD (degenerative disc disease), lumbar    multilevel, mri 10/2022   Depression    Elevated alkaline phosphatase level    Abd u/s normal 04/01/17   Epigastric pain    EGD 04/2020 normal except a few gastric polyps-->nonulcer dyspepsia   External hemorrhoid, bleeding    GAD (generalized anxiety disorder)    citalopram  helpful   GERD (gastroesophageal reflux disease)    daily prilosec OTC helps   Gilbert syndrome    H/O clavicle fracture 12/29/2018   ORIF 12/29/2018, revision 01/05/2019 b/c patient fell on clavicle AGAIN.   Hay fever    Hepatic steatosis    u/s 2024   History of depression    Obesity, Class II, BMI 35-39.9    OSA on CPAP 08/2014   Followed by pulm    Past Surgical History:  Procedure Laterality Date   coronary calcium score  09/04/2020   Zero   ESOPHAGOGASTRODUODENOSCOPY  04/2020   2021  NORMAL except a few gastric polyps.  Gastric bx: mild reactive gastropathy, H pylori NEG.  02/2022 SAME as 2021   ETT  08/2020   no ischemia   ORIF CLAVICULAR FRACTURE Right 12/28/2018   with revision 01/05/2019.  Procedure: OPEN REDUCTION INTERNAL FIXATION (ORIF) CLAVICULAR FRACTURE;  Surgeon: Arnie Lao, MD;  Location: [facility] OR;  Service: Orthopedics;  Laterality: Right;   ORIF CLAVICULAR FRACTURE Right 01/05/2019   Procedure: OPEN REDUCTION INTERNAL FIXATION (ORIF)  RIGHT CLAVICLE FRACTURE;  Surgeon: Arnie Lao, MD;  Location: [facility];  Service: Orthopedics;  Laterality: Right;   WISDOM TOOTH EXTRACTION  1998     Current Outpatient Medications:    buPROPion  (WELLBUTRIN  XL) 300 MG 24 hr tablet, Take 1 tablet (300 mg total) by mouth daily. MUST KEEP APPT FOR FURTHER REFILLS, Disp: 30 tablet, Rfl: 0   esomeprazole  (NEXIUM ) 40 MG capsule, Take 1 capsule (40 mg total) by mouth 2 (two) times daily before a meal., Disp: 60 capsule, Rfl: 1  EXAM:  VITALS per patient if applicable:     05/11/2024    2:37 PM 11/28/2023   10:32 AM 10/31/2023    3:23 PM  Vitals with BMI  Height 5\' 9"     Weight 255 lbs 249 lbs 252 lbs  BMI 37.64    Systolic  123 135  Diastolic  83 87  Pulse  83 93     GENERAL: alert, oriented, appears well  and in no acute distress  HEENT: atraumatic, conjunttiva clear, no obvious abnormalities on inspection of external nose and ears  NECK: normal movements of the head and neck  LUNGS: on inspection no signs of respiratory distress, breathing rate appears normal, no obvious gross SOB, gasping or wheezing  CV: no obvious cyanosis  MS: moves all visible extremities without noticeable abnormality  PSYCH/NEURO: pleasant and cooperative, no obvious depression or anxiety, speech and thought processing grossly intact  LABS: none today    Chemistry      Component Value Date/Time   NA 141 08/26/2023 0909   K 4.2 08/26/2023 0909   CL 106 08/26/2023  0909   CO2 28 08/26/2023 0909   BUN 17 08/26/2023 0909   CREATININE 0.91 08/26/2023 0909   CREATININE 0.91 02/15/2020 1526      Component Value Date/Time   CALCIUM 9.3 08/26/2023 0909   ALKPHOS 101 08/26/2023 0909   AST 22 08/26/2023 0909   ALT 34 08/26/2023 0909   BILITOT 1.4 (H) 08/26/2023 0909     ASSESSMENT AND PLAN:  Discussed the following assessment and plan:  Recurrent major depressive disorder, in remission. GAD, well-controlled. Continue Wellbutrin  XL 300 mg a day. Refilled today.  #2 GERD. Well-controlled on Nexium  40 mg a day. Refilled today.   I discussed the assessment and treatment plan with the patient. The patient was provided an opportunity to ask questions and all were answered. The patient agreed with the plan and demonstrated an understanding of the instructions.   F/u: 6 mo  Signed:  Arletha Lady, MD           05/11/2024
